# Patient Record
Sex: Female | Born: 1985 | Race: White | Hispanic: No | Marital: Married | Smoking: Former smoker
Health system: Southern US, Community
[De-identification: ages and names within clinical notes are randomized; demographics above are authoritative.]

## PROBLEM LIST (undated history)

## (undated) DIAGNOSIS — Z789 Other specified health status: Secondary | ICD-10-CM

## (undated) HISTORY — DX: Other specified health status: Z78.9

---

## 2013-09-17 ENCOUNTER — Emergency Department: Payer: Self-pay | Admitting: Emergency Medicine

## 2013-11-05 ENCOUNTER — Inpatient Hospital Stay: Payer: Self-pay | Admitting: Obstetrics and Gynecology

## 2013-11-05 LAB — PIH PROFILE
Anion Gap: 9 (ref 7–16)
BUN: 9 mg/dL (ref 7–18)
CHLORIDE: 104 mmol/L (ref 98–107)
CO2: 22 mmol/L (ref 21–32)
CREATININE: 0.5 mg/dL — AB (ref 0.60–1.30)
Calcium, Total: 8.8 mg/dL (ref 8.5–10.1)
EGFR (African American): >60
EGFR (Non-African Amer.): >60
Glucose: 62 mg/dL — ABNORMAL LOW (ref 65–99)
HCT: 34.4 % — AB (ref 35.0–47.0)
HGB: 11.6 g/dL — ABNORMAL LOW (ref 12.0–16.0)
MCH: 34 pg (ref 26.0–34.0)
MCHC: 33.8 g/dL (ref 32.0–36.0)
MCV: 101 fL — AB (ref 80–100)
Osmolality: 267 (ref 275–301)
PLATELETS: 214 10*3/uL (ref 150–440)
POTASSIUM: 3.3 mmol/L — AB (ref 3.5–5.1)
RBC: 3.42 10*6/uL — AB (ref 3.80–5.20)
RDW: 12.7 % (ref 11.5–14.5)
SGOT(AST): 18 U/L (ref 15–37)
SODIUM: 135 mmol/L — AB (ref 136–145)
URIC ACID: 3 mg/dL (ref 2.6–6.0)
WBC: 11.6 10*3/uL — ABNORMAL HIGH (ref 3.6–11.0)

## 2013-11-05 LAB — PROTEIN / CREATININE RATIO, URINE
CREATININE, URINE: 79.8 mg/dL (ref 30.0–125.0)
CREATININE, URINE: 44.6 mg/dL (ref 30.0–125.0)
PROTEIN/CREAT. RATIO: 202 mg/g{creat} — AB (ref 0–200)
Protein, Random Urine: 280 mg/dL — ABNORMAL HIGH (ref 0–12)
Protein, Random Urine: 9 mg/dL (ref 0–12)
Protein/Creat. Ratio: 3509 mg/gCREAT — ABNORMAL HIGH (ref 0–200)

## 2013-11-08 ENCOUNTER — Inpatient Hospital Stay: Payer: Self-pay

## 2013-11-08 LAB — CBC WITH DIFFERENTIAL/PLATELET
Basophil #: 0 10*3/uL (ref 0.0–0.1)
Basophil %: 0.2 %
Eosinophil #: 0.3 10*3/uL (ref 0.0–0.7)
Eosinophil %: 3.2 %
HCT: 34.2 % — ABNORMAL LOW (ref 35.0–47.0)
HGB: 11.9 g/dL — ABNORMAL LOW (ref 12.0–16.0)
Lymphocyte #: 3.3 10*3/uL (ref 1.0–3.6)
Lymphocyte %: 30.6 %
MCH: 34.9 pg — ABNORMAL HIGH (ref 26.0–34.0)
MCHC: 34.9 g/dL (ref 32.0–36.0)
MCV: 100 fL (ref 80–100)
Monocyte #: 0.7 x10 3/mm (ref 0.2–0.9)
Monocyte %: 6 %
NEUTROS PCT: 60 %
Neutrophil #: 6.5 10*3/uL (ref 1.4–6.5)
PLATELETS: 202 10*3/uL (ref 150–440)
RBC: 3.42 10*6/uL — AB (ref 3.80–5.20)
RDW: 12.9 % (ref 11.5–14.5)
WBC: 10.9 10*3/uL (ref 3.6–11.0)

## 2013-11-09 LAB — HEMATOCRIT: HCT: 30.7 % — AB (ref 35.0–47.0)

## 2014-09-06 NOTE — Op Note (Signed)
PATIENT NAME:  Christina [name], Christina [name] MR#:  [id number] DATE OF BIRTH:  May 04, 1986  DATE OF PROCEDURE:  11/08/2013  PREOPERATIVE DIAGNOSES:  1.  Intrauterine pregnancy at 37 weeks, 3 days gestational age. 2.  Mild preeclampsia.  3.  Christina [name] breech presentation.   POSTOPERATIVE DIAGNOSES: 1.  Intrauterine pregnancy at 37 weeks, 3 days gestational age. 2.  Mild preeclampsia.  3.  Christina [name] breech presentation.   PROCEDURE: Primary low transverse cesarean section via Pfannenstiel incision.   ANESTHESIA: Spinal.   SURGEON: Thomasene MohairStephen Jackson, M.[name].   ESTIMATED BLOOD LOSS: 750 mL.   OPERATIVE FLUIDS: 1000 mL crystalloid.   COMPLICATIONS: None.   FINDINGS:  1.  Normal-appearing gravid uterus with normal-appearing intrauterine cavity.  2.  Normal appearing fallopian tubes and ovaries.   SPECIMENS: None.   CONDITION AT THE END OF THE PROCEDURE: Stable.   PROCEDURE IN DETAIL: The patient was taken to the Operating Room where regional spinal anesthesia was administered and found to be adequate. She was placed in the dorsal supine position with a leftward tilt and prepped and draped in the usual sterile fashion. After a timeout was called, a Pfannenstiel incision was made and carried through the various layers until the peritoneum was identified and entered sharply. After extending the peritoneal opening, bladder flap was created and a bladder retractor was used to pull the bladder out of the operative area of interest. A low transverse hysterotomy was made with a scalpel and extended laterally with cranial and caudal tension. The fetal breech was grasped and with mild fundal pressure was delivered until both legs could be delivered.  A moistened blue towel was wrapped around the infant lower back and the rest of the body was elevated with delivery of each arm independently by sweeping the arm across the chest and delivering the arm separately. The neck was flexed by placing maxillary pressure and with fundal  pressure the head was delivered. The cord was clamped and cut and the infant handed to the pediatricians. The placenta was removed spontaneously intact with a three vessel cord. The uterus was cleared of all clots and debris and inspected for contour given the breech presentation. The uterine contour appeared to be normal.  The hysterotomy was closed using #3-0 Vicryl in a running, locked fashion. A second imbricating layer of 0-Vicryl was sewn in order to obtain hemostasis. The uterus was returned to the abdomen and the abdomen was cleared of all clots and debris.   The On-Q catheters were placed according to the manufacturer's recommendations. They were  placed about approximately 4 cm cephalad to the incision line and to a depth of approximately the 4th marking on the catheters. They were positioned just superficial to the rectus abdominis muscles and just deep to the rectus fascia. The fascia was closed using 1-0 looped PDS in a running fashion. Care was taken to avoid including the On-Q catheters in the closure. The subcutaneous tissue was irrigated and then reapproximated using 2-0 plain gut, such that no greater than 2 cm of dead space remained. The skin edges were reapproximated just below the dermal layer using 3-0 Vicryl to minimize skin closure tension. The skin was closed in a subcuticular fashion using #4-0 Monocryl in a subcuticular fashion. This closure was reinforced using benzoin and Steri-Strips.   The On-Q catheters were affixed to the skin using Dermabond, Steri-Strips, and Tegaderm. Each catheter was bolused with 5 mL of 0.5% Marcaine plain for a total of 10 mL.   The patient tolerated  the procedure well. Sponge, lap, and needle counts were correct x 2. For antibiotic prophylaxis, the patient received Ancef 2 grams prior to skin incision. For VTE prophylaxis, the patient was wearing pneumatic compression stockings which were on and operating throughout the entire procedure. She was taken  to the recovery area in stable condition.    ____________________________ Conard Novak, MD sdj:ts [name]: 11/08/2013 12:41:08 ET T: 11/08/2013 12:50:39 ET JOB#: [id number]  cc: Conard Novak, MD, <Dictator> Conard Novak MD ELECTRONICALLY SIGNED 11/23/2013 11:57

## 2014-09-23 NOTE — H&P (Signed)
L&D Evaluation:  History Expanded:  HPI 29 yo G1 at 27w0dgestational age by 8 wk ultrasound.  Pregnancy complicated by breech presentation.  Presented to L&D today for schedueld external cephalic version (see note for details of ECV).  BPs noted while here to be intermittently elevated in the mild range.  She has had no symptoms of headache, visual changes, or right upper quadrant pain.   Gravida 1   Blood Type (Maternal) O positive   Group B Strep Results Maternal (Result >5wks must be treated as unknown) positive   Maternal HIV Negative   Maternal Syphilis Ab Nonreactive   Maternal Varicella Immune   Rubella Results (Maternal) nonimmune   [facility]   Exam:  Vital Signs Afebrile, intermittently elevated BPs 140s/90s, otherwise nomral   Urine Protein UPC ratio 202   General no apparent distress   Mental Status clear   Chest clear   Heart normal sinus rhythm   Abdomen gravid, non-tender   Estimated Fetal Weight about 7 pounds   Fetal Position frank breech   Edema trace   Reflexes 2+   Mebranes Intact   FHT normal rate with no decels   FHT Description 125/mod var/+accels/no decels   Ucx irregular, infrequent   Skin no lesions   Impression:  Impression 1) Intrauterine pregnancy at [redacted]wks gestational age, 2) FPilar Platebreech presentation, 3) likely gestational hypertension (mild)   Plan:  Comments Patient to follow up in the office tomorrow (Thursday 6/25). Will reassess at that time. Discussed possible need to deliver early if BPs worsen. Will take out of work (next two days would be all she would miss) Gave strict precautions regarding Preeclampsia symptoms.  She should perform fetal movement counts. Of note: protein creatinine ratio initially 3500.  Repeated due to suspicion of lab erroer.  202 on repeat.   Follow Up Appointment already scheduled. tomorrow (6/25)   Labs:  Lab Results: Hepatic:  23-Jun-15 15:41   SGOT (AST) 18 (Result(s)  reported on 05 Nov 2013 at 10:38PM.)  Routine Chem:  23-Jun-15 15:41   Uric Acid, Serum 3.0 (Result(s) reported on 05 Nov 2013 at 10:38PM.)  Glucose, Serum  62  BUN 9  Creatinine (comp)  0.50  Sodium, Serum  135  Potassium, Serum  3.3  Chloride, Serum 104  CO2, Serum 22  Osmolality (calc) 267  Anion Gap 9  Calcium (Total), Serum 8.8  eGFR (Non-African American) >60 (eGFR values <652mmin/1.73 m2 may be an indication of chronic kidney disease (CKD). Calculated eGFR is useful in patients with stable renal function. The eGFR calculation will not be reliable in acutely ill patients when serum creatinine is changing rapidly. It is not useful in  patients on dialysis. The eGFR calculation may not be applicable to patients at the low and high extremes of body sizes, pregnant women, and vegetarians.)  eGFR (African American) >60  Misc Urine Chem:  23-Jun-15 21:25   Creatinine, Urine 79.8  Protein, Random Urine  280  Protein/Creat Ratio (comp)  3509 (Result(s) reported on 05 Nov 2013 at 10:18PM.)    22:53   Creatinine, Urine 44.6  Protein, Random Urine 9  Protein/Creat Ratio (comp)  202 (Result(s) reported on 05 Nov 2013 at 11:50PM.)  Routine Hem:  23-Jun-15 15:41   WBC (CBC)  11.6  RBC (CBC)  3.42  Hemoglobin (CBC)  11.6  Hematocrit (CBC)  34.4  Platelet Count (CBC) 214 (Result(s) reported on 05 Nov 2013 at 10:38PM.)  MCV  101  MCH 34.0  MCHC  33.8  RDW 12.7   Electronic Signatures: Will Bonnet (MD)  (Signed 24-Jun-15 00:22)  Authored: L&D Evaluation, Labs   Last Updated: 24-Jun-15 00:22 by Will Bonnet (MD)

## 2015-03-17 LAB — OB RESULTS CONSOLE HIV ANTIBODY (ROUTINE TESTING): HIV: NONREACTIVE

## 2015-03-17 LAB — OB RESULTS CONSOLE RUBELLA ANTIBODY, IGM: RUBELLA: IMMUNE

## 2015-03-17 LAB — OB RESULTS CONSOLE HEPATITIS B SURFACE ANTIGEN: Hepatitis B Surface Ag: NEGATIVE

## 2015-03-17 LAB — OB RESULTS CONSOLE VARICELLA ZOSTER ANTIBODY, IGG: VARICELLA IGG: IMMUNE

## 2015-03-17 LAB — OB RESULTS CONSOLE RPR: RPR: NONREACTIVE

## 2015-06-30 ENCOUNTER — Other Ambulatory Visit: Payer: Self-pay | Admitting: Obstetrics and Gynecology

## 2015-06-30 DIAGNOSIS — O2200 Varicose veins of lower extremity in pregnancy, unspecified trimester: Secondary | ICD-10-CM

## 2015-06-30 DIAGNOSIS — R1031 Right lower quadrant pain: Secondary | ICD-10-CM

## 2015-07-01 ENCOUNTER — Ambulatory Visit
Admission: RE | Admit: 2015-07-01 | Discharge: 2015-07-01 | Disposition: A | Discharge status: Home / Self Care | Payer: BC Managed Care – PPO | Source: Ambulatory Visit | Attending: Obstetrics and Gynecology | Admitting: Obstetrics and Gynecology

## 2015-07-01 DIAGNOSIS — O2200 Varicose veins of lower extremity in pregnancy, unspecified trimester: Secondary | ICD-10-CM | POA: Diagnosis not present

## 2015-07-01 DIAGNOSIS — R1030 Lower abdominal pain, unspecified: Secondary | ICD-10-CM | POA: Diagnosis not present

## 2015-07-01 DIAGNOSIS — M7989 Other specified soft tissue disorders: Secondary | ICD-10-CM | POA: Insufficient documentation

## 2015-07-01 DIAGNOSIS — Z3A00 Weeks of gestation of pregnancy not specified: Secondary | ICD-10-CM | POA: Insufficient documentation

## 2015-07-01 DIAGNOSIS — R1031 Right lower quadrant pain: Secondary | ICD-10-CM

## 2015-07-23 ENCOUNTER — Encounter: Payer: Self-pay | Admitting: General Surgery

## 2015-07-23 ENCOUNTER — Ambulatory Visit (INDEPENDENT_AMBULATORY_CARE_PROVIDER_SITE_OTHER): Payer: BC Managed Care – PPO | Admitting: General Surgery

## 2015-07-23 VITALS — BP 110/70 | HR 82 | Resp 12 | Ht 62.0 in | Wt 184.0 lb

## 2015-07-23 DIAGNOSIS — I83811 Varicose veins of right lower extremities with pain: Secondary | ICD-10-CM

## 2015-07-23 NOTE — Progress Notes (Signed)
Patient ID: Christina [name], female   DOB: 05/08/1986, 30 y.o.   MRN: [id number]  Chief Complaint  Patient presents with  . Varicose Veins    HPI Christina [name] is a 30 y.o. female.  Here today for evaluaiton of varicose vein. She is [redacted] weeks pregnant.  She states she has some pain from the right groin down her right leg. She did have an ultrasound that showed no clots.  She states the pain comes and goes. The discoloration is blue in color. She stats it first started the end of January . She states the vein is "lumpy" and tender. I have reviewed the history of present illness with the patient.  HPI  History reviewed. No pertinent past medical history.  Past Surgical History  Procedure Laterality Date  . Cesarean section  11-08-13    History reviewed. No pertinent family history.  Social History Social History  Substance Use Topics  . Smoking status: Former Smoker    Quit date: 05/16/2014  . Smokeless tobacco: None  . Alcohol Use: No    No Known Allergies  Current Outpatient Prescriptions  Medication Sig Dispense Refill  . Multiple Vitamins-Minerals (MULTIVITAMIN WITH MINERALS) tablet Take 1 tablet by mouth daily.     No current facility-administered medications for this visit.    Review of Systems Review of Systems  Constitutional: Negative.   Respiratory: Negative.   Cardiovascular: Negative.     Blood pressure 110/70, pulse 82, resp. rate 12, height 5\' 2"  (1.575 m), weight 184 lb (83.462 kg).  Physical Exam Physical Exam  Constitutional: She is oriented to person, place, and time. She appears well-developed and well-nourished.  HENT:  Mouth/Throat: Oropharynx is clear and moist.  Eyes: Conjunctivae are normal. No scleral icterus.  Neck: Neck supple.  Cardiovascular: Normal rate, regular rhythm and normal heart sounds.   Pulses:      Dorsalis pedis pulses are 2+ on the right side, and 2+ on the left side.       Posterior tibial pulses are 2+ on the right  side, and 2+ on the left side.  Scattered VV right groin area, some outer right thigh. NO edema.   Pulmonary/Chest: Effort normal and breath sounds normal.  Lymphadenopathy:    She has no cervical adenopathy.  Neurological: She is alert and oriented to person, place, and time.  Skin: Skin is warm and dry.  Psychiatric: Her behavior is normal.    Data Reviewed  Venous Duplex study  Assessment    VV in right groin and thigh. No evidence of phlebitis-either deep or superficial.     Plan   Disucussed findings with pt. She does require compression hose which has been prescribed already by her obstetrician. Potential problems with VV explained. Also advised against pronged sitting or standing.   Follow up in one month. Obtain and wear compression hose.     [facility] RE[facility] COLEEN GUTIERREZ CNM    Katerra Ingman G 07/27/2015, 8:57 AM

## 2015-07-23 NOTE — Patient Instructions (Signed)
The patient is aware to call back for any questions or concerns.  

## 2015-07-27 ENCOUNTER — Encounter: Payer: Self-pay | Admitting: General Surgery

## 2015-08-24 ENCOUNTER — Encounter: Payer: Self-pay | Admitting: General Surgery

## 2015-08-24 ENCOUNTER — Ambulatory Visit (INDEPENDENT_AMBULATORY_CARE_PROVIDER_SITE_OTHER): Payer: BC Managed Care – PPO | Admitting: General Surgery

## 2015-08-24 VITALS — BP 118/70 | HR 78 | Resp 12 | Ht 62.0 in | Wt 185.0 lb

## 2015-08-24 DIAGNOSIS — I83811 Varicose veins of right lower extremities with pain: Secondary | ICD-10-CM | POA: Diagnosis not present

## 2015-08-24 NOTE — Progress Notes (Signed)
This is a [age] female here today for right leg VV. Patient states no change since last visit.  She has been wearing her compression hose daily as advised. Still feels some burning in upper right thigh area, not any worse. I have reviewed the history of present illness with the patient.  Exam shows the VV cluster in upper right thigh anterior and lateral. Smaller cluster in calf area. No evidence of superficial phlebitis No edema in her legs.    Patient to return at the end of June -due date is May 29- for bilateral ultrasound. Continue to wear compression hose.  PCP:  [facility]  This information has been scribed by Ples SpecterJessica Qualls CMA.

## 2015-08-24 NOTE — Patient Instructions (Signed)
Return in two months

## 2015-08-25 ENCOUNTER — Encounter: Payer: Self-pay | Admitting: General Surgery

## 2015-09-30 ENCOUNTER — Inpatient Hospital Stay
Admission: EM | Admit: 2015-09-30 | Discharge: 2015-10-02 | DRG: 765 | Disposition: A | Discharge status: Home / Self Care | Payer: BC Managed Care – PPO | Attending: Advanced Practice Midwife | Admitting: Advanced Practice Midwife

## 2015-09-30 DIAGNOSIS — Z87891 Personal history of nicotine dependence: Secondary | ICD-10-CM | POA: Diagnosis not present

## 2015-09-30 DIAGNOSIS — O34219 Maternal care for unspecified type scar from previous cesarean delivery: Secondary | ICD-10-CM | POA: Diagnosis present

## 2015-09-30 DIAGNOSIS — Z3A38 38 weeks gestation of pregnancy: Secondary | ICD-10-CM

## 2015-09-30 DIAGNOSIS — O9081 Anemia of the puerperium: Secondary | ICD-10-CM | POA: Diagnosis not present

## 2015-09-30 DIAGNOSIS — D62 Acute posthemorrhagic anemia: Secondary | ICD-10-CM | POA: Diagnosis not present

## 2015-09-30 DIAGNOSIS — O3433 Maternal care for cervical incompetence, third trimester: Secondary | ICD-10-CM | POA: Diagnosis present

## 2015-09-30 DIAGNOSIS — O4292 Full-term premature rupture of membranes, unspecified as to length of time between rupture and onset of labor: Principal | ICD-10-CM | POA: Diagnosis present

## 2015-09-30 LAB — CBC
HCT: 34.4 % — ABNORMAL LOW (ref 35.0–47.0)
Hemoglobin: 11.7 g/dL — ABNORMAL LOW (ref 12.0–16.0)
MCH: 31.8 pg (ref 26.0–34.0)
MCHC: 34.1 g/dL (ref 32.0–36.0)
MCV: 93.1 fL (ref 80.0–100.0)
PLATELETS: 204 10*3/uL (ref 150–440)
RBC: 3.69 MIL/uL — ABNORMAL LOW (ref 3.80–5.20)
RDW: 13.2 % (ref 11.5–14.5)
WBC: 11.1 10*3/uL — AB (ref 3.6–11.0)

## 2015-09-30 LAB — RAPID HIV SCREEN (HIV 1/2 AB+AG)
HIV 1/2 ANTIBODIES: NONREACTIVE
HIV-1 P24 ANTIGEN - HIV24: NONREACTIVE

## 2015-09-30 LAB — TYPE AND SCREEN
ABO/RH(D): O POS
ANTIBODY SCREEN: NEGATIVE

## 2015-09-30 LAB — ABO/RH: ABO/RH(D): O POS

## 2015-09-30 MED ORDER — BUTORPHANOL TARTRATE 1 MG/ML IJ SOLN
1.0000 mg | INTRAMUSCULAR | Status: DC | PRN
  Administered 2015-10-01: 1 mg via INTRAVENOUS
  Filled 2015-09-30: qty 1

## 2015-09-30 MED ORDER — MISOPROSTOL 200 MCG PO TABS
ORAL_TABLET | ORAL | Status: AC
  Filled 2015-09-30: qty 4

## 2015-09-30 MED ORDER — OXYTOCIN 40 UNITS IN LACTATED RINGERS INFUSION - SIMPLE MED
2.5000 [IU]/h | INTRAVENOUS | Status: DC
  Administered 2015-10-01: 500 mL via INTRAVENOUS
  Filled 2015-09-30: qty 1000

## 2015-09-30 MED ORDER — ONDANSETRON HCL 4 MG/2ML IJ SOLN: 4.0000 mg | Freq: Four times a day (QID) | INTRAMUSCULAR | Status: DC | PRN

## 2015-09-30 MED ORDER — SODIUM CHLORIDE 0.9 % IV SOLN
INTRAVENOUS | Status: AC
  Administered 2015-09-30: 2 g via INTRAVENOUS
  Filled 2015-09-30: qty 2000

## 2015-09-30 MED ORDER — LIDOCAINE HCL (PF) 1 % IJ SOLN
INTRAMUSCULAR | Status: AC
  Filled 2015-09-30: qty 30

## 2015-09-30 MED ORDER — LACTATED RINGERS IV SOLN
500.0000 mL | INTRAVENOUS | Status: DC | PRN
  Administered 2015-10-01: 500 mL via INTRAVENOUS

## 2015-09-30 MED ORDER — TERBUTALINE SULFATE 1 MG/ML IJ SOLN: 0.2500 mg | Freq: Once | INTRAMUSCULAR | Status: DC | PRN

## 2015-09-30 MED ORDER — BUTORPHANOL TARTRATE 1 MG/ML IJ SOLN
2.0000 mg | INTRAMUSCULAR | Status: DC | PRN
  Administered 2015-09-30: 2 mg via INTRAVENOUS
  Filled 2015-09-30: qty 2

## 2015-09-30 MED ORDER — SODIUM CHLORIDE 0.9 % IV SOLN
2.0000 g | Freq: Once | INTRAVENOUS | Status: AC
  Administered 2015-09-30: 2 g via INTRAVENOUS

## 2015-09-30 MED ORDER — LACTATED RINGERS IV SOLN
INTRAVENOUS | Status: DC
  Administered 2015-09-30: 125 mL/h via INTRAVENOUS
  Administered 2015-09-30 (×2): via INTRAVENOUS

## 2015-09-30 MED ORDER — SODIUM CHLORIDE 0.9 % IV SOLN
1.0000 g | INTRAVENOUS | Status: DC
  Administered 2015-09-30 – 2015-10-01 (×4): 1 g via INTRAVENOUS
  Filled 2015-09-30 (×5): qty 1000

## 2015-09-30 MED ORDER — OXYTOCIN BOLUS FROM INFUSION: 500.0000 mL | INTRAVENOUS | Status: DC

## 2015-09-30 MED ORDER — AMMONIA AROMATIC IN INHA
RESPIRATORY_TRACT | Status: AC
  Filled 2015-09-30: qty 10

## 2015-09-30 MED ORDER — OXYTOCIN 10 UNIT/ML IJ SOLN
INTRAMUSCULAR | Status: AC
  Filled 2015-09-30: qty 2

## 2015-09-30 MED ORDER — OXYTOCIN 40 UNITS IN LACTATED RINGERS INFUSION - SIMPLE MED
1.0000 m[IU]/min | INTRAVENOUS | Status: DC
  Filled 2015-09-30: qty 1000

## 2015-09-30 MED ORDER — OXYTOCIN 40 UNITS IN LACTATED RINGERS INFUSION - SIMPLE MED
1.0000 m[IU]/min | INTRAVENOUS | Status: DC
  Administered 2015-09-30: 1 m[IU]/min via INTRAVENOUS
  Administered 2015-09-30: 2 m[IU]/min via INTRAVENOUS
  Administered 2015-09-30: 3 m[IU]/min via INTRAVENOUS

## 2015-09-30 MED ORDER — ACETAMINOPHEN 325 MG PO TABS: 650.0000 mg | ORAL_TABLET | ORAL | Status: DC | PRN

## 2015-09-30 NOTE — Plan of Care (Signed)
Report received/Assumed care of pt.  Pt is G21001 EDC 10/12/2015  38.2 weeks.  Pt ruptured at 0250 this am. Clear large amount of fluid. Pt is a previous c/section for breech presentation. She is planning as of this time to VBAC/has discussed with Dr. Tiburcio PeaHarris at length.  Report given stated pt is 0.5/40/-3.  Pt is having a "Boy" Zachery and plans to breast feed her son.  Husband Bethann BerkshireJohnny and family at bedside.  Pt is GBS negative per Pt.  Will call office for other labs at 0800.  Ellison Carwin Josehua Hammar RNC

## 2015-09-30 NOTE — Progress Notes (Signed)
L&D Note  09/30/2015 - 2:38 PM  30 y.o. G2P1 551w2d   [name] is admitted for PROM   Subjective:  Feeling more painful ctx  Objective:   Filed Vitals:   09/30/15 0900 09/30/15 1018 09/30/15 1219 09/30/15 1358  BP:  131/87 123/76 124/75  Pulse:  80 74 59  Temp: 98.1 F (36.7 C) 98.1 F (36.7 C) 98 F (36.7 C) 98.2 F (36.8 C)  TempSrc: Oral Oral Oral Oral  Resp:      Height:      Weight:        Current Vital Signs 24h Vital Sign Ranges  T 98.2 F (36.8 C) Temp  Avg: 98.1 F (36.7 C)  Min: 97.8 F (36.6 C)  Max: 98.4 F (36.9 C)  BP  (37309) BP  Min: 113/64  Max: 139/86  HR (!) 59 Pulse  Avg: 80.8  Min: 59  Max: 94  RR 19 Resp  Avg: 19  Min: 19  Max: 19  SaO2   Not Delivered No Data Recorded       24 Hour I/O Current Shift I/O  Time Ins Outs   05/17 0701 - 05/17 1900 In: 875 [I.V.:875] Out: -     FHR: category 1 Toco: q 2-4 min SVE: 1/50/-2   Assessment :  IUP at 5951w2d, TOLAC after PROM    Plan:  Increase max pitocin to 10 mu/min for now Pain management per pt request Con't ampicillin for GBS+ swab  Marta AntuBrothers, Concepcion Kirkpatrick, [place]

## 2015-09-30 NOTE — H&P (Signed)
Obstetrics Admission History & Physical   Spontaneous Rupture of Membranes   HPI:  30 y.o. G2P1 @ 4131w2d (10/12/2015, by Patient Reported). Admitted on 09/30/2015:   Presents for 0200 SROM, min ctxs since that time.  [facility] previous h/o CS for breech 2 years ago, no other complications.  Informed consent for VBAC thru prenatal care and still desires.  Denies diabetes, HTN, PTL, growth concerns of fetus.  GBS neg.  PMHx: History reviewed. No pertinent past medical history. PSHx:  Past Surgical History  Procedure Laterality Date  . Cesarean section  11-08-13   Medications:  Prescriptions prior to admission  Medication Sig Dispense Refill Last Dose  . Multiple Vitamins-Minerals (MULTIVITAMIN WITH MINERALS) tablet Take 1 tablet by mouth daily.   Taking   Allergies: has No Known Allergies. OBHx:  OB History  Gravida Para Term Preterm AB SAB TAB Ectopic Multiple Living  2 1            # Outcome Date GA Lbr Len/2nd Weight Sex Delivery Anes PTL Lv  2 Current           1 Para             Obstetric Comments  1st Menstrual Cycle:  ?   1st Pregnancy:  27   UJW:JXBJYNWG/[id number]:Negative/unremarkable except as detailed in HPI. Soc Hx: Pregnancy welcomed  Objective:   Filed Vitals:   09/30/15 0405  BP: 133/88  Pulse: 94  Temp: 98.4 F (36.9 C)  Resp: 19   General: Well nourished, well developed female in no acute distress.  Skin: Warm and dry.  Cardiovascular:Regular rate and rhythm. Respiratory: Clear to auscultation bilateral. Normal respiratory effort Abdomen: gravid, VTX, NT Neuro/Psych: Normal mood and affect.   Pelvic exam: is not limited by body habitus EGBUS: within normal limits Vagina: within normal limits and with no blood in the vault Cervix: FT, 40, -3 Uterus: Spontaneous uterine activity  Adnexa: not evaluated  EFM:FHR: 130 bpm, variability: moderate,  accelerations:  Present,  decelerations:  Absent Toco: Frequency: Every 5-7  minutes   Perinatal info:  Blood type: pending records Rubella- Unknown Varicella -Unknown TDaP Given during third trimester of this pregnancy RPR NR / HIV Neg/ HBsAg Neg   Assessment & Plan:   30 y.o. G2P1 @ 6731w2d, Admitted on 09/30/2015: with SROM 3 hours ago with some contractions and min dilation to cervix.  Options for VTOL/ VBAC discussed in detail at this time.  Pros and cons, risks.  Pt is early as far as cervix is concerned and not able to use ripening agents.  Options of expectant management at first and even augmentation with Pitocin discussed.  Uterine rupture risks discussed. [facility]..Marland Kitchen

## 2015-09-30 NOTE — Progress Notes (Signed)
L&D Note  09/30/2015 - 6:28 PM  30 y.o. G2P1 7684w2d   [name] is admitted for PROM   Subjective:  Requesting pain medication  Objective:   Filed Vitals:   09/30/15 1219 09/30/15 1358 09/30/15 1504 09/30/15 1740  BP: 123/76 124/75 123/69 153/85  Pulse: 74 59 64 74  Temp: 98 F (36.7 C) 98.2 F (36.8 C) 98.3 F (36.8 C) 98.1 F (36.7 C)  TempSrc: Oral Oral Oral Oral  Resp:   16 16  Height:      Weight:        Current Vital Signs 24h Vital Sign Ranges  T 98.1 F (36.7 C) Temp  Avg: 98.1 F (36.7 C)  Min: 97.8 F (36.6 C)  Max: 98.4 F (36.9 C)  BP (!) 153/85 mmHg BP  Min: 113/64  Max: 153/85  HR 74 Pulse  Avg: 77.9  Min: 59  Max: 94  RR 16 Resp  Avg: 17  Min: 16  Max: 19  SaO2   Not Delivered No Data Recorded       24 Hour I/O Current Shift I/O  Time Ins Outs   05/17 0701 - 05/17 1900 In: 875 [I.V.:875] Out: -     FHR: category 1 tracing, baseline 140 Toco: q 2-3 min SVE: 3/50/-2 after AROM   Assessment :  IUP at 4184w2d, TOLAC, PROM    Plan:  AROM of forebag Stadol now, recheck for cervical change in about 2 hours. Pt verbalizes concern about minimal progress. Reviewed expected labor progress given unfavorable cervix and PROM on admission. At this point progress is as expected. Will continue to monitor.   Marta AntuBrothers, Suyash Amory, [place]

## 2015-09-30 NOTE — Progress Notes (Signed)
S: Pt still with irregular ctx, denies increasing strength of contractions  O: Category 1 tracing. Ctx irregular.  Prenatal labs: O+/RI/VI/panel negative/GBS +  A: IUP at 5143w2d, PROM  P: VBAC protocol initiated - Dr Jean RosenthalJackson, anesthesia, OR and Nursing Supervisor aware.  Will begin low-dose Pitocin (start at 1 mu/min, increase q 30 min until 5 mu/min until favorable cervix)  Begin ampicillin for GBS ppx Stadol/epidural as desired

## 2015-10-01 ENCOUNTER — Encounter: Payer: Self-pay | Admitting: Anesthesiology

## 2015-10-01 ENCOUNTER — Inpatient Hospital Stay: Payer: BC Managed Care – PPO | Admitting: Anesthesiology

## 2015-10-01 ENCOUNTER — Encounter
Admission: EM | Disposition: A | Discharge status: Home / Self Care | Payer: Self-pay | Source: Home / Self Care | Attending: Advanced Practice Midwife

## 2015-10-01 LAB — RPR: RPR Ser Ql: NONREACTIVE

## 2015-10-01 LAB — CBC
HEMATOCRIT: 34.7 % — AB (ref 35.0–47.0)
HEMOGLOBIN: 11.8 g/dL — AB (ref 12.0–16.0)
MCH: 32.1 pg (ref 26.0–34.0)
MCHC: 34 g/dL (ref 32.0–36.0)
MCV: 94.5 fL (ref 80.0–100.0)
Platelets: 207 10*3/uL (ref 150–440)
RBC: 3.67 MIL/uL — AB (ref 3.80–5.20)
RDW: 13.2 % (ref 11.5–14.5)
WBC: 13 10*3/uL — ABNORMAL HIGH (ref 3.6–11.0)

## 2015-10-01 SURGERY — Surgical Case
Anesthesia: Choice | Wound class: Clean Contaminated

## 2015-10-01 MED ORDER — SIMETHICONE 80 MG PO CHEW
80.0000 mg | CHEWABLE_TABLET | Freq: Three times a day (TID) | ORAL | Status: DC
  Administered 2015-10-01 – 2015-10-02 (×5): 80 mg via ORAL
  Filled 2015-10-01 (×5): qty 1

## 2015-10-01 MED ORDER — NALBUPHINE HCL 10 MG/ML IJ SOLN: 5.0000 mg | INTRAMUSCULAR | Status: DC | PRN

## 2015-10-01 MED ORDER — CITRIC ACID-SODIUM CITRATE 334-500 MG/5ML PO SOLN
30.0000 mL | ORAL | Status: AC
  Administered 2015-10-01: 30 mL via ORAL
  Filled 2015-10-01: qty 30

## 2015-10-01 MED ORDER — COCONUT OIL OIL
1.0000 | TOPICAL_OIL | Status: DC | PRN
Start: 2015-10-01 — End: 2015-10-03
  Administered 2015-10-01: 1 via TOPICAL
  Filled 2015-10-01: qty 120

## 2015-10-01 MED ORDER — IBUPROFEN 600 MG PO TABS: 600.0000 mg | ORAL_TABLET | Freq: Four times a day (QID) | ORAL | Status: DC | PRN

## 2015-10-01 MED ORDER — OXYTOCIN 40 UNITS IN LACTATED RINGERS INFUSION - SIMPLE MED
2.5000 [IU]/h | INTRAVENOUS | Status: DC
  Administered 2015-10-01: 2.5 [IU]/h via INTRAVENOUS

## 2015-10-01 MED ORDER — LIDOCAINE HCL (PF) 2 % IJ SOLN
INTRAMUSCULAR | Status: DC | PRN
  Administered 2015-10-01 (×4): 5 mg via INTRADERMAL

## 2015-10-01 MED ORDER — DIPHENHYDRAMINE HCL 25 MG PO CAPS: 25.0000 mg | ORAL_CAPSULE | Freq: Four times a day (QID) | ORAL | Status: DC | PRN

## 2015-10-01 MED ORDER — ONDANSETRON HCL 4 MG/2ML IJ SOLN: 4.0000 mg | Freq: Three times a day (TID) | INTRAMUSCULAR | Status: DC | PRN

## 2015-10-01 MED ORDER — LACTATED RINGERS IV SOLN
INTRAVENOUS | Status: DC
  Administered 2015-10-02: 05:00:00 via INTRAVENOUS

## 2015-10-01 MED ORDER — BUPIVACAINE 0.25 % ON-Q PUMP DUAL CATH 400 ML
400.0000 mL | INJECTION | Status: DC
  Filled 2015-10-01: qty 400

## 2015-10-01 MED ORDER — IBUPROFEN 600 MG PO TABS
600.0000 mg | ORAL_TABLET | Freq: Four times a day (QID) | ORAL | Status: DC
  Administered 2015-10-01: 600 mg via ORAL
  Filled 2015-10-01 (×2): qty 1

## 2015-10-01 MED ORDER — SCOPOLAMINE 1 MG/3DAYS TD PT72: 1.0000 | MEDICATED_PATCH | Freq: Once | TRANSDERMAL | Status: DC

## 2015-10-01 MED ORDER — NALOXONE HCL 0.4 MG/ML IJ SOLN: 0.4000 mg | INTRAMUSCULAR | Status: DC | PRN

## 2015-10-01 MED ORDER — DIPHENHYDRAMINE HCL 25 MG PO CAPS
25.0000 mg | ORAL_CAPSULE | ORAL | Status: DC | PRN
Start: 2015-10-01 — End: 2015-10-03

## 2015-10-01 MED ORDER — DIBUCAINE 1 % RE OINT: 1.0000 "application " | TOPICAL_OINTMENT | RECTAL | Status: DC | PRN

## 2015-10-01 MED ORDER — SODIUM CHLORIDE 0.9% FLUSH: 3.0000 mL | INTRAVENOUS | Status: DC | PRN

## 2015-10-01 MED ORDER — KETOROLAC TROMETHAMINE 30 MG/ML IJ SOLN: 30.0000 mg | Freq: Four times a day (QID) | INTRAMUSCULAR | Status: DC | PRN

## 2015-10-01 MED ORDER — NALBUPHINE HCL 10 MG/ML IJ SOLN: 5.0000 mg | Freq: Once | INTRAMUSCULAR | Status: DC | PRN

## 2015-10-01 MED ORDER — WITCH HAZEL-GLYCERIN EX PADS: 1.0000 "application " | MEDICATED_PAD | CUTANEOUS | Status: DC | PRN

## 2015-10-01 MED ORDER — CEFAZOLIN SODIUM-DEXTROSE 2-4 GM/100ML-% IV SOLN
2.0000 g | INTRAVENOUS | Status: AC
  Administered 2015-10-01: 2 g via INTRAVENOUS
  Filled 2015-10-01 (×2): qty 100

## 2015-10-01 MED ORDER — PRENATAL MULTIVITAMIN CH
1.0000 | ORAL_TABLET | Freq: Every day | ORAL | Status: DC
  Administered 2015-10-02: 1 via ORAL
  Filled 2015-10-01: qty 1

## 2015-10-01 MED ORDER — OXYCODONE-ACETAMINOPHEN 5-325 MG PO TABS: 2.0000 | ORAL_TABLET | ORAL | Status: DC | PRN

## 2015-10-01 MED ORDER — MEPERIDINE HCL 25 MG/ML IJ SOLN: 6.2500 mg | INTRAMUSCULAR | Status: DC | PRN

## 2015-10-01 MED ORDER — MORPHINE SULFATE (PF) 0.5 MG/ML IJ SOLN: INTRAMUSCULAR | Status: DC | PRN

## 2015-10-01 MED ORDER — MENTHOL 3 MG MT LOZG: 1.0000 | LOZENGE | OROMUCOSAL | Status: DC | PRN

## 2015-10-01 MED ORDER — NALOXONE HCL 2 MG/2ML IJ SOSY
1.0000 ug/kg/h | PREFILLED_SYRINGE | INTRAVENOUS | Status: DC | PRN
  Filled 2015-10-01: qty 2

## 2015-10-01 MED ORDER — DEXTROSE 5 % IV SOLN
500.0000 mg | INTRAVENOUS | Status: AC
  Administered 2015-10-01: 500 mg via INTRAVENOUS
  Filled 2015-10-01: qty 500

## 2015-10-01 MED ORDER — DIPHENHYDRAMINE HCL 50 MG/ML IJ SOLN: 12.5000 mg | INTRAMUSCULAR | Status: DC | PRN

## 2015-10-01 MED ORDER — BUPIVACAINE ON-Q PAIN PUMP (FOR ORDER SET NO CHG)
INJECTION | Status: DC
  Filled 2015-10-01: qty 1

## 2015-10-01 MED ORDER — IBUPROFEN 600 MG PO TABS
600.0000 mg | ORAL_TABLET | Freq: Four times a day (QID) | ORAL | Status: DC
Start: 2015-10-01 — End: 2015-10-02
  Administered 2015-10-01 – 2015-10-02 (×2): 600 mg via ORAL
  Filled 2015-10-01 (×2): qty 1

## 2015-10-01 MED ORDER — BUPIVACAINE HCL (PF) 0.5 % IJ SOLN
5.0000 mL | Freq: Once | INTRAMUSCULAR | Status: DC
  Filled 2015-10-01: qty 30

## 2015-10-01 MED ORDER — OXYCODONE-ACETAMINOPHEN 5-325 MG PO TABS: 1.0000 | ORAL_TABLET | ORAL | Status: DC | PRN

## 2015-10-01 MED ORDER — MORPHINE SULFATE (PF) 2 MG/ML IV SOLN: 1.0000 mg | INTRAVENOUS | Status: DC | PRN

## 2015-10-01 MED ORDER — MORPHINE SULFATE (PF) 0.5 MG/ML IJ SOLN
INTRAMUSCULAR | Status: DC | PRN
  Administered 2015-10-01: 3 mg via EPIDURAL

## 2015-10-01 MED ORDER — ONDANSETRON HCL 4 MG/2ML IJ SOLN: 4.0000 mg | Freq: Once | INTRAMUSCULAR | Status: DC | PRN

## 2015-10-01 MED ORDER — FENTANYL 2.5 MCG/ML W/ROPIVACAINE 0.2% IN NS 100 ML EPIDURAL INFUSION (ARMC-ANES): 10.0000 mL/h | EPIDURAL | Status: DC

## 2015-10-01 MED ORDER — OXYTOCIN 40 UNITS IN LACTATED RINGERS INFUSION - SIMPLE MED
INTRAVENOUS | Status: AC
  Filled 2015-10-01: qty 1000

## 2015-10-01 MED ORDER — BUPIVACAINE HCL (PF) 0.25 % IJ SOLN
INTRAMUSCULAR | Status: DC | PRN
  Administered 2015-10-01 (×2): 4 mL via EPIDURAL

## 2015-10-01 MED ORDER — BUPIVACAINE HCL (PF) 0.5 % IJ SOLN: 5.0000 mL | Freq: Once | INTRAMUSCULAR | Status: DC

## 2015-10-01 MED ORDER — LIDOCAINE-EPINEPHRINE (PF) 1.5 %-1:200000 IJ SOLN
INTRAMUSCULAR | Status: DC | PRN
  Administered 2015-10-01: 3 mL via EPIDURAL

## 2015-10-01 MED ORDER — BUPIVACAINE HCL (PF) 0.5 % IJ SOLN
INTRAMUSCULAR | Status: DC | PRN
  Administered 2015-10-01: 10 mL

## 2015-10-01 MED ORDER — FERROUS SULFATE 325 (65 FE) MG PO TABS
325.0000 mg | ORAL_TABLET | Freq: Two times a day (BID) | ORAL | Status: DC
  Administered 2015-10-01 – 2015-10-02 (×3): 325 mg via ORAL
  Filled 2015-10-01 (×3): qty 1

## 2015-10-01 MED ORDER — DIPHENHYDRAMINE HCL 25 MG PO CAPS: 25.0000 mg | ORAL_CAPSULE | ORAL | Status: DC | PRN

## 2015-10-01 MED ORDER — SENNOSIDES-DOCUSATE SODIUM 8.6-50 MG PO TABS
2.0000 | ORAL_TABLET | ORAL | Status: DC
  Administered 2015-10-01: 2 via ORAL
  Filled 2015-10-01: qty 2

## 2015-10-01 MED ORDER — BUPIVACAINE HCL (PF) 0.5 % IJ SOLN
INTRAMUSCULAR | Status: DC | PRN
  Administered 2015-10-01 (×3): 2 mL

## 2015-10-01 MED ORDER — FENTANYL CITRATE (PF) 100 MCG/2ML IJ SOLN
25.0000 ug | INTRAMUSCULAR | Status: DC | PRN
  Administered 2015-10-01 (×2): 25 ug via INTRAVENOUS
  Filled 2015-10-01: qty 2

## 2015-10-01 MED ORDER — FENTANYL 2.5 MCG/ML W/ROPIVACAINE 0.2% IN NS 100 ML EPIDURAL INFUSION (ARMC-ANES)
EPIDURAL | Status: AC
  Administered 2015-10-01: 10 mL/h via EPIDURAL
  Filled 2015-10-01: qty 100

## 2015-10-01 SURGICAL SUPPLY — 29 items
CANISTER SUCT 3000ML (MISCELLANEOUS) ×3 IMPLANT
CATH KIT ON-Q SILVERSOAK 5IN (CATHETERS) ×6 IMPLANT
CLOSURE WOUND 1/2 X4 (GAUZE/BANDAGES/DRESSINGS) ×1
DRSG OPSITE POSTOP 4X10 (GAUZE/BANDAGES/DRESSINGS) ×6 IMPLANT
DRSG TELFA 3X8 NADH (GAUZE/BANDAGES/DRESSINGS) ×3 IMPLANT
ELECT CAUTERY BLADE 6.4 (BLADE) ×3 IMPLANT
ELECT REM PT RETURN 9FT ADLT (ELECTROSURGICAL) ×3
ELECTRODE REM PT RTRN 9FT ADLT (ELECTROSURGICAL) ×1 IMPLANT
GAUZE SPONGE 4X4 12PLY STRL (GAUZE/BANDAGES/DRESSINGS) IMPLANT
GLOVE BIO SURGEON STRL SZ7 (GLOVE) ×6 IMPLANT
GLOVE INDICATOR 7.5 STRL GRN (GLOVE) ×12 IMPLANT
GOWN STRL REUS W/ TWL LRG LVL3 (GOWN DISPOSABLE) ×3 IMPLANT
GOWN STRL REUS W/TWL LRG LVL3 (GOWN DISPOSABLE) ×6
LIQUID BAND (GAUZE/BANDAGES/DRESSINGS) ×6 IMPLANT
NS IRRIG 1000ML POUR BTL (IV SOLUTION) ×3 IMPLANT
PACK C SECTION AR (MISCELLANEOUS) ×3 IMPLANT
PAD OB MATERNITY 4.3X12.25 (PERSONAL CARE ITEMS) ×3 IMPLANT
PAD PREP 24X41 OB/GYN DISP (PERSONAL CARE ITEMS) ×3 IMPLANT
SPONGE LAP 18X18 5 PK (GAUZE/BANDAGES/DRESSINGS) ×12 IMPLANT
STRIP CLOSURE SKIN 1/2X4 (GAUZE/BANDAGES/DRESSINGS) ×2 IMPLANT
SUT CHROMIC GUT BROWN 0 54 (SUTURE) ×1 IMPLANT
SUT CHROMIC GUT BROWN 0 54IN (SUTURE) ×3
SUT MNCRL 4-0 (SUTURE) ×2
SUT MNCRL 4-0 27XMFL (SUTURE) ×1
SUT PDS AB 1 TP1 96 (SUTURE) ×3 IMPLANT
SUT PLAIN 2 0 XLH (SUTURE) ×3 IMPLANT
SUT VIC AB 0 CT1 36 (SUTURE) ×12 IMPLANT
SUTURE MNCRL 4-0 27XMF (SUTURE) ×1 IMPLANT
SWABSTK COMLB BENZOIN TINCTURE (MISCELLANEOUS) ×3 IMPLANT

## 2015-10-01 NOTE — Transfer of Care (Signed)
Immediate Anesthesia Transfer of Care Note  Patient: Christina [name]  Procedure(s) Performed: Procedure(s): CESAREAN SECTION (N/A)  Patient Location: PACU  Anesthesia Type:General  Level of Consciousness: sedated  Airway & Oxygen Therapy: Patient Spontanous Breathing and Patient connected to face mask oxygen  Post-op Assessment: Report given to RN and Post -op Vital signs reviewed and stable  Post vital signs: Reviewed and stable  Last Vitals:  Filed Vitals:   10/01/15 0815 10/01/15 1005  BP: 136/84 123/67  Pulse: 84   Temp:  36.1 C  Resp:  12    Complications: No apparent anesthesia complications

## 2015-10-01 NOTE — Discharge Summary (Signed)
OB Discharge Summary  Patient Name: Christina [name] DOB: 04-Sep-1985 MRN: [id number]  Date of admission: 09/30/2015 Delivering MD: Thomasene Mohair, MD Date of Delivery: 10/01/2015  Date of discharge: 10/03/2015  Admitting diagnosis:  1) intrauterine pregnancy at [redacted]w[redacted]d  2) Spontaneous rupture of membranes 3) history of prior cesarean delivery, desires Trial of labor 4) obesity affecting pregnancy, third trimester 5) Body mass index is 33.67 kg/(m^2).   Intrauterine pregnancy: [redacted]w[redacted]d      Secondary diagnosis: TOLAC     Discharge diagnosis: Term Pregnancy Delivered                                                                                                Post partum procedures: none  Augmentation: Pitocin  Complications: None  Hospital course:  Induction of Labor With Cesarean Section  30 y.o. yo G2P1001 at [redacted]w[redacted]d was admitted to the hospital 09/30/2015 for spontaneous rupture of membranes. She desired a TOLAC.  She was given pitocin augmentation after she did not spontaneously labor.  She progressed to 4cm dilation.  The fetal heart tracing became concerning with prolonged late decelerations and minimal variability. Given her history of cesarean delivery and that she was remote from vaginal delivery, she was taken to the operating room for delivery as above.  The patient went for cesarean section due to Non-Reassuring FHR, and delivered a Viable infant. APGARs were 4 at one minute, 5 at 5 minutes, and 9 at 10 minutes.  The pediatric team was present and stated that the reason for low APGARs was due to thick mucus in the fetal nares and the baby was vigorous at the time of delivery.  Once the nares were cleared the fetus became much more active.  Pediatrics declined cord blood gases.  Membrane Rupture Time/Date: )2:50 AM ,09/30/2015   Details of operation can be found in separate operative Note.  Patient had an uncomplicated postpartum course. She is ambulating, tolerating a regular diet,  passing flatus, and urinating well.  Patient is discharged home in stable condition on 10/03/2015.   She requested to be discharged home.                          Physical exam  Filed Vitals:   10/02/15 0744 10/02/15 1218 10/02/15 1654 10/02/15 1915  BP: 135/84 125/80  133/74  Pulse: 75 70  65  Temp: 98.1 F (36.7 C) 98 F (36.7 C) 98.2 F (36.8 C) 98.7 F (37.1 C)  TempSrc: Oral Oral Oral Oral  Resp: Height:      Weight:      SpO2: 99%   100%   General: NAD, breastfeeding Lochia: minimal Uterine Fundus: firm below umbilicus Incision: c/d/i, on-q pump in place DVT Evaluation: LEs non-tender to palpation  Labs: Lab Results  Component Value Date   WBC 12.5* 10/02/2015   HGB 10.1* 10/02/2015   HCT 29.8* 10/02/2015   MCV 94.2 10/02/2015   PLT 180 10/02/2015   CMP Latest Ref Rng 11/05/2013  Glucose 65-99 mg/dL 82(N)  BUN 5-62 mg/dL 9  Creatinine 0.60-1.30 mg/dL 4.78(G0.50(L)  Sodium 956-213136-145 mmol/L 135(L)  Potassium 3.5-5.1 mmol/L 3.3(L)  Chloride 98-107 mmol/L 104  CO2 21-32 mmol/L 22  Calcium 8.5-10.1 mg/dL 8.8  AST 12-6513-37 Unit/L 18    Discharge instruction: per After Visit Summary.  Medications:    Medication List    TAKE these medications        multivitamin with minerals tablet  Take 1 tablet by mouth daily.        Diet: routine diet  Activity: Advance as tolerated. Pelvic rest for 6 weeks.   Outpatient follow up: No Follow-up on file.  Postpartum contraception: TBD Rhogam Given postpartum: n/a Rubella vaccine given postpartum: no Varicella vaccine given postpartum: no TDaP given antepartum or postpartum: TDaP given AP Flu vaccine given antepartum or postpartum: n/a  Newborn Data: Live born female  Birth Weight: 7 lb 13.6 oz (3560 g) APGAR: 4, 5, 9    Baby Feeding: breast  Disposition:home with mother  SIGNED:  ----- Ranae Plumberhelsea Jeremaine Maraj, MD Attending Obstetrician and Gynecologist [facility]

## 2015-10-01 NOTE — Op Note (Addendum)
Cesarean Section Procedure Note   Randie M Seaborn   10/01/2015   Pre-operative Diagnosis:  1) intrauterine pregnancy at [redacted]w[redacted]d  2) SROM 3) Failed VBAC 4) history of prior cesarean delivery  Post-operative Diagnosis: 1) intrauterine pregnancy at [redacted]w[redacted]d  2) SROM 3) Failed VBAC 4) history of prior cesarean delivery  Procedure: Repeat Cesarean Delivery via Pfannenstiel incision with double layer closure  Surgeon: Surgeon(s) and Role:    * Conard Novak, MD - Primary    * [place] Bing, MD - Assisting   Anesthesia: epidural   Findings:  1) normal appearing gravid uterus, fallopian tubes, and ovaries 2) viable female infant with weight of 3,560 grams and APGARs 4 at 1 minute, 5 at 5 minutes and 9 at 10 minutes (pediatrics declined cord blood gases)   Estimated Blood Loss: 800 mL  Total IV Fluids: 1,000 ml   Urine Output: 450 mL clear urine  Specimens: None  Complications: no complications  Disposition: PACU - hemodynamically stable.   Maternal Condition: stable   Baby condition / location:  Couplet care / Skin to Skin  Procedure Details:  The patient was seen in the Holding Room. The risks, benefits, complications, treatment options, and expected outcomes were discussed with the patient. The patient concurred with the proposed plan, giving informed consent. identified as Armie M Sabala and the procedure verified as C-Section Delivery. A Time Out was held and the above information confirmed.   After induction of anesthesia, the patient was draped and prepped in the usual sterile manner. A Pfannenstiel incision was made and carried down through the subcutaneous tissue to the fascia. Fascial incision was made and extended transversely. The fascia was separated from the underlying rectus tissue superiorly and inferiorly. The peritoneum was identified and entered. Peritoneal incision was extended longitudinally. The bladder flap was not bluntly freed from the lower uterine  segment. A low transverse uterine incision was made and the hysterotomy was extended with cranial-caudal tension. Delivered from cephalic presentation was a 3,560 gram Living newborn infant(s) or Female with Apgar scores of 4 at one minute and 5 at five minutes and 9 at 10 minutes. Cord ph was not sent, though a segment of cord was saved.  The pediatric team declined cord blood gases given that the reason for low APGARs was due to a thick mucus collection in the fetal nares and not respiratory depression.  The umbilical cord was clamped and cut.  Cord blood was obtained for evaluation. The placenta was removed Intact and appeared normal. The uterine outline, tubes and ovaries appeared normal. The uterine incision was closed with running locked sutures of 0 Vicryl.  A second layer of the same suture was thrown in an imbricating fashion.  Hemostasis was assured.  The uterus was retained to the abdomen and the paracolic gutters were cleared of all clots and debris.  The rectus muscles were inspected and found to be hemostatic.  The On-Q catheter pumps were inserted in accordance with the manufacturer's recommendations.  The catheters were inserted approximately 4cm cephelad to the incision line, approximately 1cm apart, straddling the midline.  They were inserted to a depth of the 4th mark. They were positioned superficial to the rectus abdominus muscles and deep to the rectus fascia.    The fascia was then reapproximated with running sutures of 1-0 PDS, looped. The subcutaneous tissue was reapproximated using 2-0 plain gut such that no greater than 2cm of dead space remained. The subcuticular closure was performed using 4-0 monocryl. The  skin closure was reinforced using surgical skin glue.  The On-Q catheters were bolused with 5 mL of 0.5% marcaine plain for a total of 10 mL.  The catheters were affixed to the skin with surgical skin glue, steri-strips, and tegaderm.    Instrument, sponge, and needle counts  were correct prior the abdominal closure and were correct at the conclusion of the case.  The patient received Ancef 2 gram IV prior to skin incision (within 30 minutes) and Azithromycin $RemoveBefor [id number]$500mge was wearing SCDs throughout the case.   Signed: Conard NovakStephen D. Mayana Irigoyen, MD 10/01/2015 9:58 AM

## 2015-10-01 NOTE — Anesthesia Procedure Notes (Signed)
Epidural Patient location during procedure: OB Start time: 10/01/2015 5:20 AM End time: 10/01/2015 5:30 AM  Staffing Anesthesiologist: Lenard SimmerKARENZ, Jamarien Rodkey Performed by: anesthesiologist   Preanesthetic Checklist Completed: patient identified, site marked, surgical consent, pre-op evaluation, timeout performed, IV checked, risks and benefits discussed and monitors and equipment checked  Epidural Patient position: sitting Prep: ChloraPrep Patient monitoring: heart rate, continuous pulse ox and blood pressure Approach: midline Location: L3-L4 Injection technique: LOR saline  Needle:  Needle type: Tuohy  Needle gauge: 17 G Needle length: 9 cm and 9 Needle insertion depth: 5 cm Catheter type: closed end flexible Catheter size: 19 Gauge Catheter at skin depth: 9.5 cm Test dose: negative and 1.5% lidocaine with Epi 1:200 K  Assessment Sensory level: T8 Events: blood not aspirated, injection not painful, no injection resistance, negative IV test and no paresthesia  Additional Notes   Patient tolerated the insertion well without immediate complications.Reason for block:procedure for pain

## 2015-10-01 NOTE — Anesthesia Preprocedure Evaluation (Signed)
Anesthesia Evaluation  Patient identified by MRN, date of birth, ID band Patient awake    Reviewed: Allergy & Precautions, H&P , NPO status , Patient's Chart, lab work & pertinent test results, reviewed documented beta blocker date and time   History of Anesthesia Complications Negative for: history of anesthetic complications  Airway Mallampati: I  TM Distance: >3 FB Neck ROM: full    Dental no notable dental hx. (+) Teeth Intact   Pulmonary neg pulmonary ROS, former smoker,    Pulmonary exam normal breath sounds clear to auscultation       Cardiovascular Exercise Tolerance: Good negative cardio ROS Normal cardiovascular exam Rhythm:regular Rate:Normal     Neuro/Psych negative neurological ROS  negative psych ROS   GI/Hepatic negative GI ROS, Neg liver ROS,   Endo/Other  negative endocrine ROS  Renal/GU negative Renal ROS  negative genitourinary   Musculoskeletal   Abdominal   Peds  Hematology negative hematology ROS (+)   Anesthesia Other Findings History reviewed. No pertinent past medical history.   Reproductive/Obstetrics (+) Pregnancy                             Anesthesia Physical Anesthesia Plan  ASA: II  Anesthesia Plan: Epidural   Post-op Pain Management:    Induction:   Airway Management Planned:   Additional Equipment:   Intra-op Plan:   Post-operative Plan:   Informed Consent: I have reviewed the patients History and Physical, chart, labs and discussed the procedure including the risks, benefits and alternatives for the proposed anesthesia with the patient or authorized representative who has indicated his/her understanding and acceptance.   Dental Advisory Given  Plan Discussed with: Anesthesiologist, CRNA and Surgeon  Anesthesia Plan Comments:         Anesthesia Quick Evaluation

## 2015-10-01 NOTE — Progress Notes (Signed)
Notified that patient had a prolonged deceleration at about 6am this morning to 70s-80s. Return to baseline but now with minimal variability and ? Late decelerations.  Patient has made minimal cervical change recently and given her fetal tracing, will move forward with repeat cesarean delivery.  Discussed with patient and she is amenable to plan.  Discussed risks/benefits/alternatives with patient and she voiced understanding.   Thomasene MohairStephen Nazifa Trinka, MD 10/01/2015 8:24 AM

## 2015-10-01 NOTE — Progress Notes (Signed)
L&D Note  10/01/2015 - 7:44 AM  29 y.o. G2P1 8564w3d   [name] is admitted for PROM   Subjective:  Feeling comfortable after epidural  Objective:   Filed Vitals:   10/01/15 0704 10/01/15 0709 10/01/15 0714 10/01/15 0715  BP:    126/79  Pulse:    70  Temp:    98.2 F (36.8 C)  TempSrc:    Oral  Resp:    16  Height:      Weight:      SpO2: 97% 98% 98%     Current Vital Signs 24h Vital Sign Ranges  T 98.2 F (36.8 C) Temp  Avg: 98.1 F (36.7 C)  Min: 97.8 F (36.6 C)  Max: 98.3 F (36.8 C)  BP 126/79 mmHg BP  Min: 113/73  Max: 153/85  HR 70 Pulse  Avg: 74.4  Min: 59  Max: 98  RR 16 Resp  Avg: 16  Min: 16  Max: 16  SaO2 98 % Not Delivered SpO2  Avg: 97.3 %  Min: 94 %  Max: 100 %       24 Hour I/O Current Shift I/O  Time Ins Outs 05/17 0701 - 05/18 0700 In: 875 [I.V.:875] Out: -       FHR: baseline 150 with minimal variability. Decelerations noted at 0600 this am. Several late decelerations around 0700 noted, resolved with position changes.  Toco: IUPC just placed.  SVE: 4/80/-1   Assessment :  IUP at 38w 3d, PROM, TOLAC  Plan:  IUPC placed.  Dr Jean RosenthalJackson notified to review strip and of pt's status.    Christina [name], Christina [name], [place]

## 2015-10-02 ENCOUNTER — Encounter: Payer: Self-pay | Admitting: Registered Nurse

## 2015-10-02 ENCOUNTER — Encounter: Payer: Self-pay | Admitting: Obstetrics and Gynecology

## 2015-10-02 LAB — CBC
HEMATOCRIT: 29.8 % — AB (ref 35.0–47.0)
HEMOGLOBIN: 10.1 g/dL — AB (ref 12.0–16.0)
MCH: 31.7 pg (ref 26.0–34.0)
MCHC: 33.7 g/dL (ref 32.0–36.0)
MCV: 94.2 fL (ref 80.0–100.0)
PLATELETS: 180 10*3/uL (ref 150–440)
RBC: 3.17 MIL/uL — AB (ref 3.80–5.20)
RDW: 13.5 % (ref 11.5–14.5)
WBC: 12.5 10*3/uL — ABNORMAL HIGH (ref 3.6–11.0)

## 2015-10-02 MED ORDER — IBUPROFEN 600 MG PO TABS
600.0000 mg | ORAL_TABLET | Freq: Four times a day (QID) | ORAL | Status: DC
  Administered 2015-10-02 (×2): 600 mg via ORAL
  Filled 2015-10-02 (×2): qty 1

## 2015-10-02 NOTE — Anesthesia Postprocedure Evaluation (Signed)
Anesthesia Post Note  Patient: Christina [name]  Procedure(s) Performed: Procedure(s) (LRB): CESAREAN SECTION (N/A)  Patient location during evaluation: Mother Baby Anesthesia Type: Epidural Level of consciousness: awake and alert Pain management: pain level controlled Vital Signs Assessment: post-procedure vital signs reviewed and stable Respiratory status: spontaneous breathing, nonlabored ventilation and respiratory function stable Cardiovascular status: stable Postop Assessment: no headache, no backache and epidural receding Anesthetic complications: no    Last Vitals:  Filed Vitals:   10/02/15 0038 10/02/15 0357  BP: 125/87 121/72  Pulse: 64 71  Temp: 36.5 C 36.9 C  Resp: 20 20    Last Pain:  Filed Vitals:   10/02/15 0357  PainSc: 0-No pain                 Starling Mannsurtis,  Aranza Geddes A

## 2015-10-02 NOTE — Progress Notes (Addendum)
Cesarean post op day 1 Subjective:   Doing well, ambulating well, tolerating regular PO diet, foley still in place, tolerating pain with PO meds.  NO CP, SOB, Fever/chills, nausea/vomiting, or calf pain.   Objective:  Blood pressure 135/84, pulse 75, temperature 98.1 F (36.7 C), temperature source Oral, resp. rate 20, height 5\' 3"  (1.6 m), weight 86.183 kg (190 lb), SpO2 99 %, unknown if currently breastfeeding.  General: NAD Pulmonary: no increased work of breathing Abdomen: non-distended, non-tender, fundus firm at level of umbilicus Incision: Extremities: no edema, no erythema, no tenderness  Results for orders placed or performed during the hospital encounter of 09/30/15 (from the past 24 hour(s))  CBC     Status: Abnormal   Collection Time: 10/02/15  5:07 AM  Result Value Ref Range   WBC 12.5 (H) 3.6 - 11.0 K/uL   RBC 3.17 (L) 3.80 - 5.20 MIL/uL   Hemoglobin 10.1 (L) 12.0 - 16.0 g/dL   HCT 86.529.8 (L) 78.435.0 - 69.647.0 %   MCV 94.2 80.0 - 100.0 fL   MCH 31.7 26.0 - 34.0 pg   MCHC 33.7 32.0 - 36.0 g/dL   RDW 29.513.5 28.411.5 - 13.214.5 %   Platelets 180 150 - 440 K/uL    Intake/Output Summary (Last 24 hours) at 10/02/15 [id number] Last data filed at 10/02/15 0600  Gross per 24 hour  Intake    300 ml  Output   2925 ml  Net  -2625 ml      Assessment:   29 y.o. G2P1001 postoperative day #1   Plan:  1) Acute blood loss anemia - hemodynamically stable and asymptomatic - po ferrous sulfate  2) Remove foley, advance diet as tolerated, encourage OOB.  Routine post op cares.  3) TDAP UTD  4) Breastfeeding  5) Disposition: continue inpatient care postop.   ----- Ranae Plumberhelsea Jolicia Delira, MD Attending Obstetrician and Gynecologist [facility]

## 2015-10-02 NOTE — Anesthesia Post-op Follow-up Note (Signed)
  Anesthesia Pain Follow-up Note  Patient: Christina [name]  Day #: 1  Date of Follow-up: 10/02/2015 Time: 7:26 AM  Last Vitals:  Filed Vitals:   10/02/15 0038 10/02/15 0357  BP: 125/87 121/72  Pulse: 64 71  Temp: 36.5 C 36.9 C  Resp: 20 20    Level of Consciousness: alert  Pain: none   Side Effects:None  Catheter Site Exam:clean, dry, no drainage  Plan: D/C from anesthesia care  Starling Mannsurtis,  Paarth Cropper A

## 2015-10-02 NOTE — Discharge Instructions (Signed)

## 2015-10-03 NOTE — Progress Notes (Deleted)
Patient discharged to home with infant. Patient left 3rd floor via w/c with infant in her arms accompanied by Arnoldo Hookerterri Ervey Fallin RN. Patient left [facility] secured with seat belt in car accompanied by her husband and infant and 2 other sons,

## 2015-10-03 NOTE — Progress Notes (Signed)
Patient v/u of all dr's discharge instructions; copy given. Patient left [facility] with infant via car secured with seatbelt accompanied by husband and 2 other sons.

## 2015-10-09 ENCOUNTER — Inpatient Hospital Stay: Admission: RE | Admit: 2015-10-09 | Payer: BC Managed Care – PPO | Source: Ambulatory Visit

## 2015-10-13 ENCOUNTER — Encounter: Admission: RE | Payer: Self-pay | Source: Ambulatory Visit

## 2015-10-13 ENCOUNTER — Inpatient Hospital Stay
Admission: RE | Admit: 2015-10-13 | Payer: BC Managed Care – PPO | Source: Ambulatory Visit | Admitting: Obstetrics and Gynecology

## 2015-10-13 SURGERY — Surgical Case
Anesthesia: Choice

## 2015-10-20 ENCOUNTER — Encounter: Payer: Self-pay | Admitting: *Deleted

## 2015-10-27 ENCOUNTER — Encounter: Payer: Self-pay | Admitting: General Surgery

## 2015-10-27 ENCOUNTER — Ambulatory Visit (INDEPENDENT_AMBULATORY_CARE_PROVIDER_SITE_OTHER): Payer: BC Managed Care – PPO | Admitting: General Surgery

## 2015-10-27 VITALS — BP 122/78 | HR 62 | Resp 12 | Ht 63.0 in | Wt 166.0 lb

## 2015-10-27 DIAGNOSIS — I83811 Varicose veins of right lower extremities with pain: Secondary | ICD-10-CM

## 2015-10-27 NOTE — Patient Instructions (Signed)
Patient educated on clot formation risks with long periods of travel with periods of sitting with limited movement and the importance of compression hose.  Increased risk with pregnancy.

## 2015-10-27 NOTE — Progress Notes (Signed)
Patient ID: Christina [name], female   DOB: 06/15/1985, 30 y.o.   MRN: [id number] Following up here for her varicose veins. She states they seem to be much better. She is s/p caesarean section 10/01/15. Previously prominent vein in the thigh and calf area on right appear to have resolved and the burning she had in the outer right thigh has also resolved.  Patient reports decreased pain with walking . I have reviewed the history of present illness with the patient.  Marked improvement on exam with barely visible varicose veins and nontender to palpation.  Legs with no edema, skin changes. Pulses intact and equal bilaterally.  No increased filling of veins on dependency.   Patient educated on clot formation risks with long periods of travel with periods of sitting with limited movement and the importance of compression hose.  Increased risk with pregnancy.  No need for ultrasound today. Call if any recurrence or new symptoms develop in the legs.

## 2016-01-15 ENCOUNTER — Encounter: Payer: Self-pay | Admitting: Pediatrics

## 2016-09-30 IMAGING — US US EXTREM LOW VENOUS*R*
1 series · 14 of 24 positions shown · non-contrast
Comparison: None

CLINICAL DATA: Right leg swelling x3 weeks.  Varicose veins.

EXAM:
RIGHT LOWER EXTREMITY VENOUS DOPPLER ULTRASOUND
TECHNIQUE: Gray-scale sonography with compression, as well as color and duplex
ultrasound, were performed to evaluate the deep venous system from
the level of the common femoral vein through the popliteal and
proximal calf veins.

[Series 1: us extrem low venous*right* · 0.08mm/px · 14 of 48 slices shown]
[im 1/48]
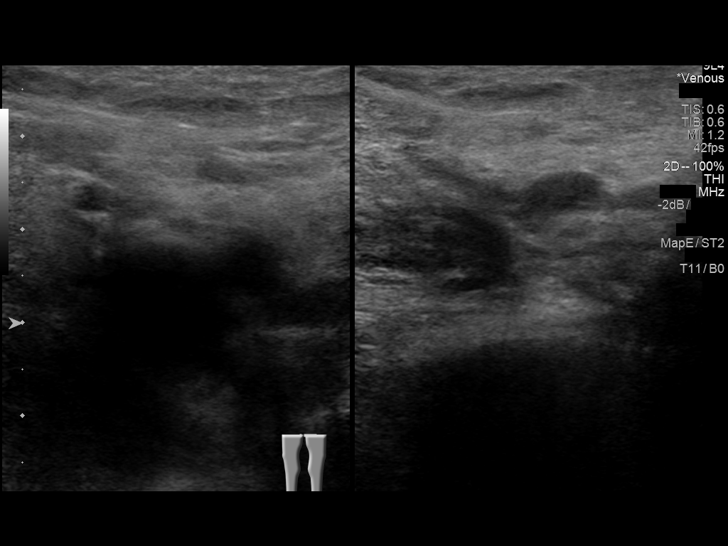
[im 5/48]
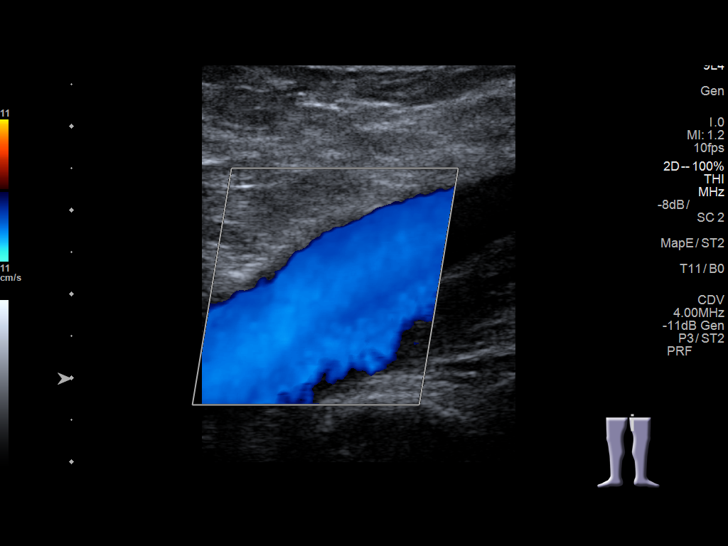
[im 9/48]
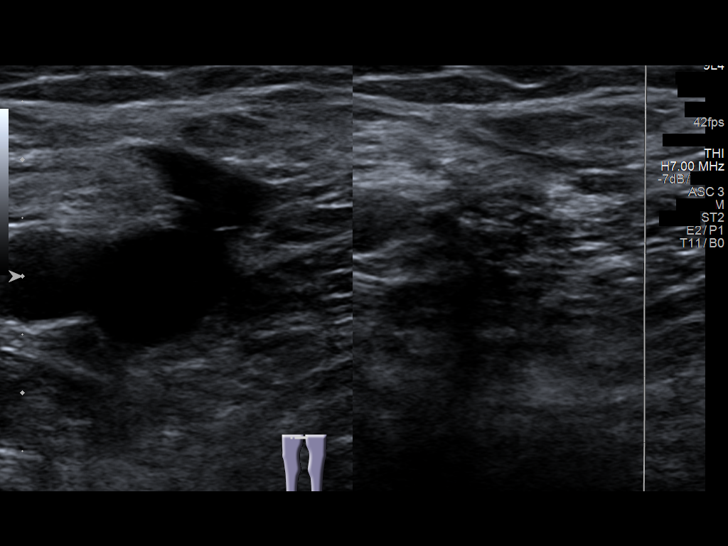
[im 13/48]
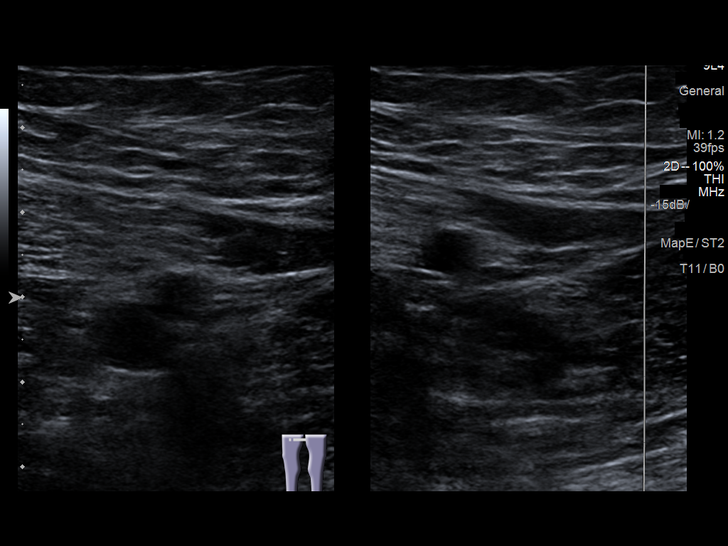
[im 15/48]
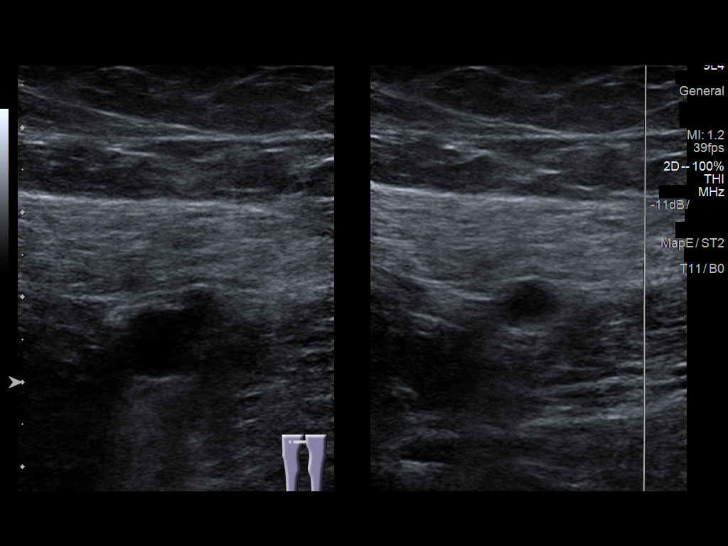
[im 19/48]
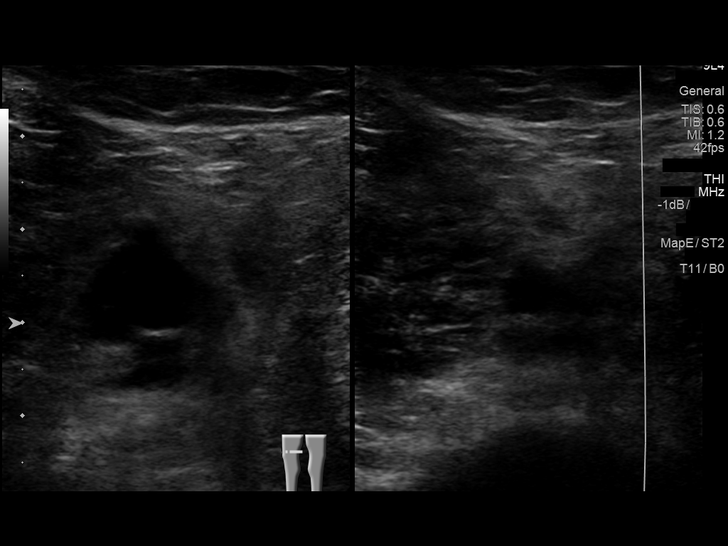
[im 23/48]
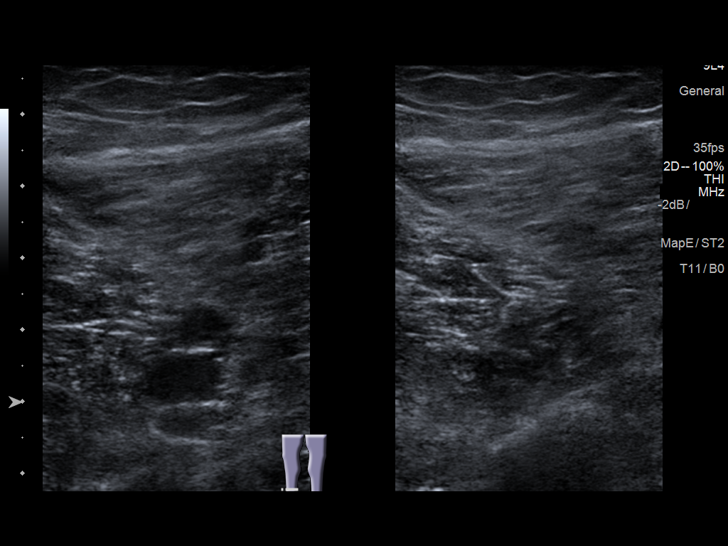
[im 25/48]
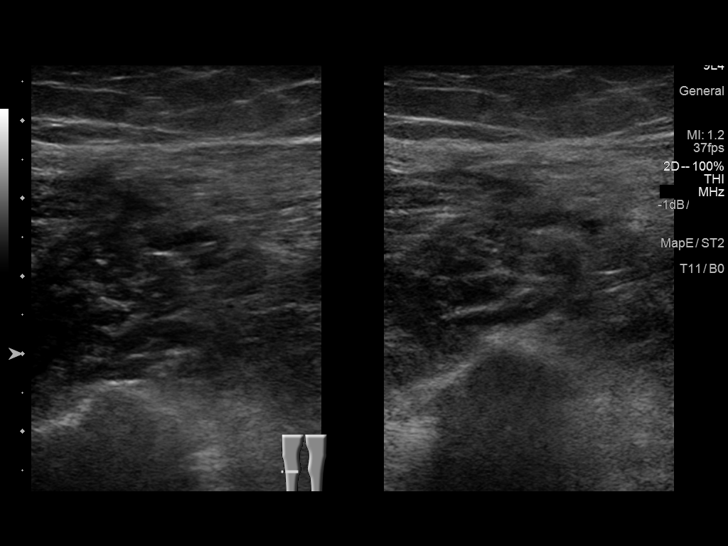
[im 29/48]
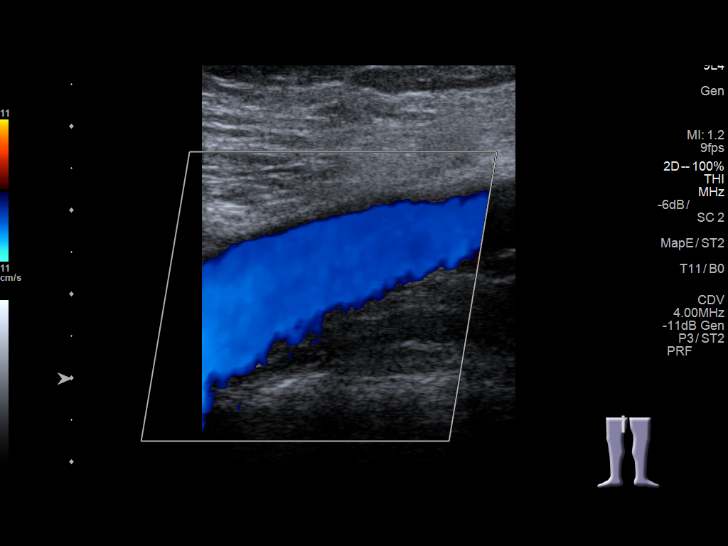
[im 33/48]
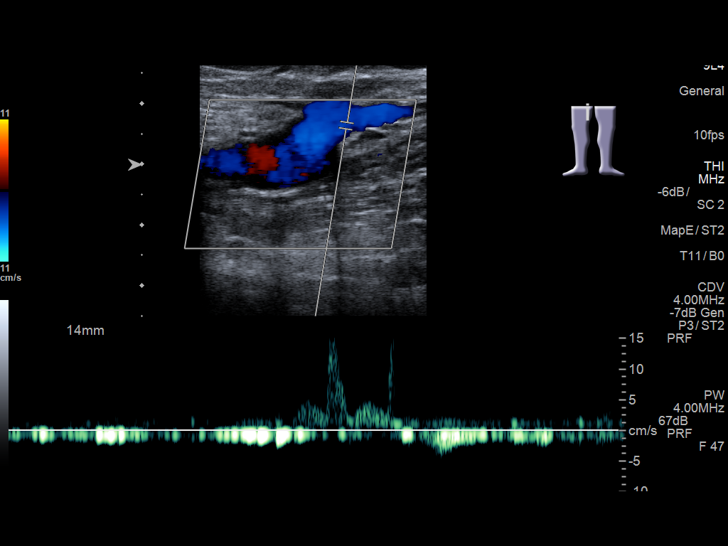
[im 37/48]
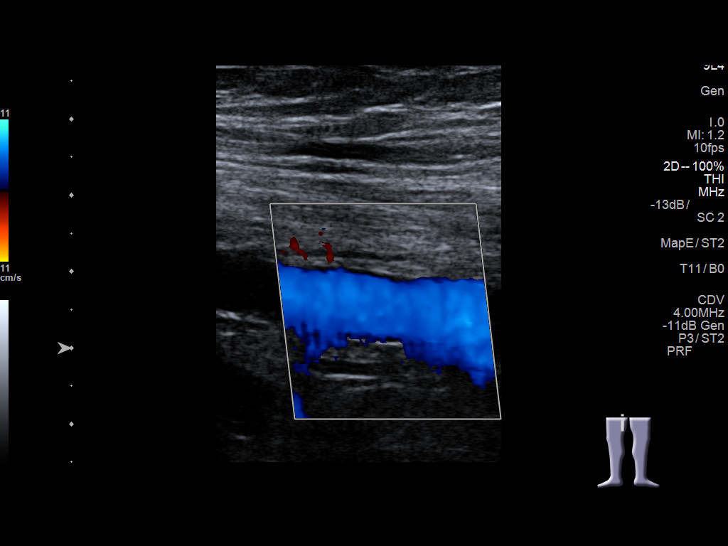
[im 39/48]
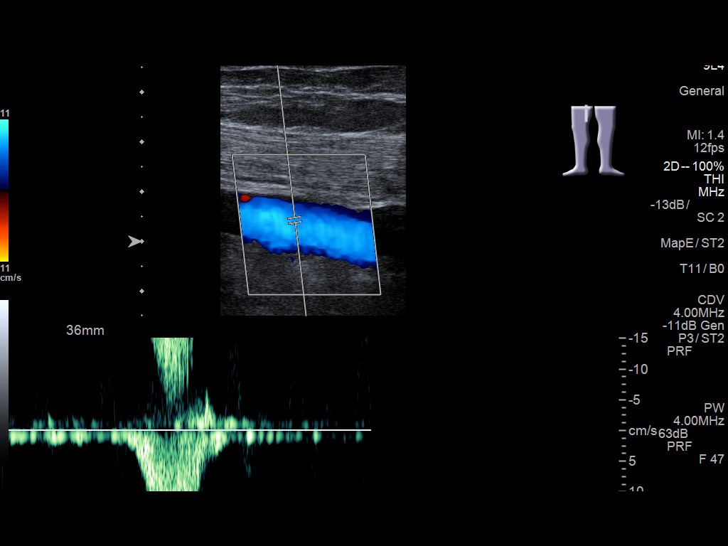
[im 43/48]
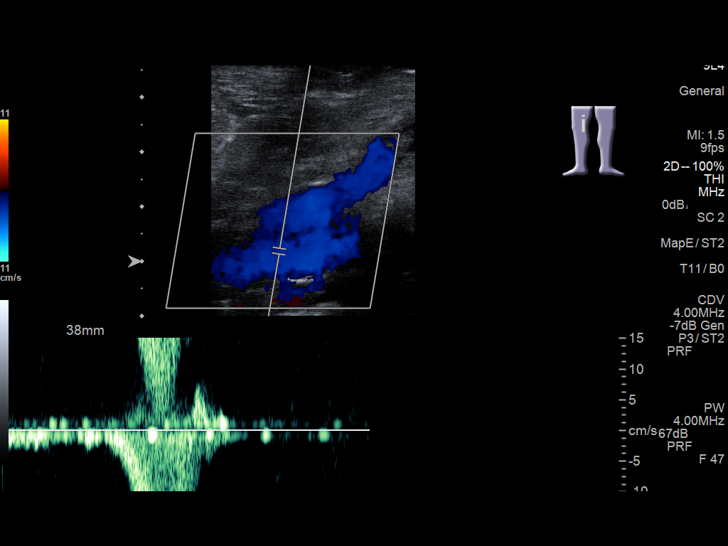
[im 48/48]
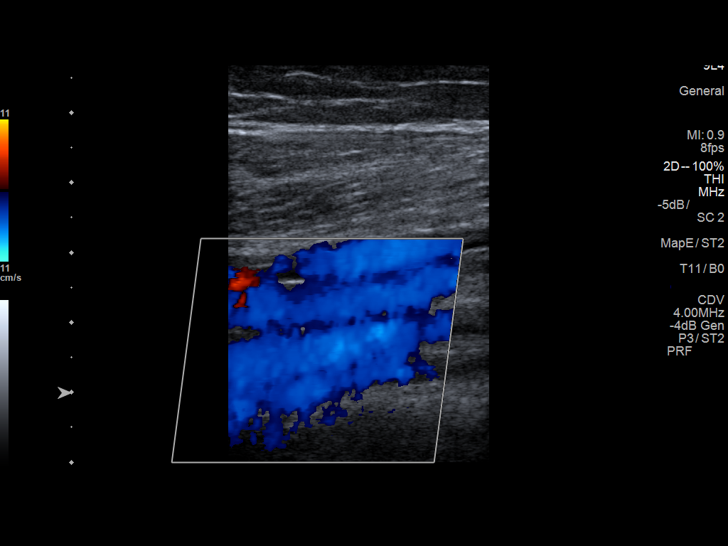

[14 of 24 positions shown; findings below may reference images not displayed]

FINDINGS: Normal compressibility of the common femoral, superficial femoral,
and popliteal veins, as well as the proximal calf veins. No filling
defects to suggest DVT on grayscale or color Doppler imaging.
Doppler waveforms show normal direction of venous flow, normal
respiratory phasicity and response to augmentation. A reflux study
of the superficial venous system not performed. Survey views of the
contralateral common femoral vein are unremarkable.
IMPRESSION: 1. No evidence of lower extremity deep vein thrombosis, RIGHT.

## 2017-01-13 ENCOUNTER — Encounter: Payer: Self-pay | Admitting: Advanced Practice Midwife

## 2017-01-13 ENCOUNTER — Ambulatory Visit (INDEPENDENT_AMBULATORY_CARE_PROVIDER_SITE_OTHER): Payer: BC Managed Care – PPO | Admitting: Advanced Practice Midwife

## 2017-01-13 VITALS — BP 120/90 | HR 81 | Ht 63.0 in | Wt 192.0 lb

## 2017-01-13 DIAGNOSIS — Z98891 History of uterine scar from previous surgery: Secondary | ICD-10-CM

## 2017-01-13 DIAGNOSIS — R102 Pelvic and perineal pain: Secondary | ICD-10-CM

## 2017-01-13 DIAGNOSIS — Z975 Presence of (intrauterine) contraceptive device: Secondary | ICD-10-CM

## 2017-01-13 NOTE — Progress Notes (Signed)
S: The patient is here today for complaint of severe low pelvic pain that has happened 3 times in the last 9 months. She is s/p c/section May 2017. She had and IUD placed in July of 2017. In January of this year she had a stabbing pain in her lower abdomen that was located around her incision site. She said it felt like "something needed to come out" and when she sat on the toilet it felt like everything was pushing up into her abdomen up to her ribs. This pain lasted for about an hour and a half and then subsided. Her abdomen was tender following this event for a couple of days. Last week she had a similar occurrence except that it was milder. Then on Wednesday of this week the pain happened again. This time it lasted for about 2 hours and yesterday she felt abdominal tenderness and bloating. She had intercourse prior to the last episode but otherwise she knows of no aggravating factors. She took Ibuprofen which helped some with the pain. She admits to regular bowel and bladder function. She denies dietary changes or food sensitivities. She has rare spotting since having the IUD.   O: Vital Signs: BP 120/90 (BP Location: Left Arm, Patient Position: Sitting, Cuff Size: Normal)   Pulse 81   Ht 5\' 3"  (1.6 m)   Wt 192 lb (87.1 kg)   BMI 34.01 kg/m  Constitutional: Well nourished, well developed female in no acute distress.  HEENT: normal Skin: Warm and dry.  Cardiovascular: Regular rate and rhythm.   Respiratory: Clear to auscultation bilateral. Normal respiratory effort Abdomen: soft, mildly tender, nondistended, no abnormal masses, no epigastric pain Back: no CVAT Psych: Alert and Oriented x3. No memory deficits. Normal mood and affect.  MS: normal gait, normal bilateral lower extremity ROM/strength/stability.  Pelvic exam:  is not limited by body habitus EGBUS: within normal limits Vagina: within normal limits and with normal mucosa  Cervix: no speculum exam, strings felt Uterus: mildly tender  to palpation, no masses, not enlarged Adnexa: no tenderness to palpation  A: 31 yo female with pelvic pain, possible scar tissue from surgery, possible mal-placement of IUD, other uterine pathology, or possible gastric upset  P: Gyn u/s for uterine evaluation, IUD placement.  F/U already scheduled with Dr Jean RosenthalJackson on 9/11 Heat, ibuprofen to affected area if pain recurs   Tresea MallJane Neville Pauls, CNM

## 2017-01-24 ENCOUNTER — Other Ambulatory Visit: Payer: Self-pay

## 2017-01-24 ENCOUNTER — Ambulatory Visit: Payer: Self-pay | Admitting: Obstetrics and Gynecology

## 2017-02-08 ENCOUNTER — Ambulatory Visit (INDEPENDENT_AMBULATORY_CARE_PROVIDER_SITE_OTHER): Payer: Medicaid Other | Admitting: Obstetrics and Gynecology

## 2017-02-08 ENCOUNTER — Inpatient Hospital Stay (INDEPENDENT_AMBULATORY_CARE_PROVIDER_SITE_OTHER): Payer: Medicaid Other

## 2017-02-08 ENCOUNTER — Encounter: Payer: Self-pay | Admitting: Obstetrics and Gynecology

## 2017-02-08 VITALS — BP 124/70 | Ht 62.0 in | Wt 194.0 lb

## 2017-02-08 DIAGNOSIS — Z1339 Encounter for screening examination for other mental health and behavioral disorders: Secondary | ICD-10-CM

## 2017-02-08 DIAGNOSIS — R102 Pelvic and perineal pain: Secondary | ICD-10-CM

## 2017-02-08 DIAGNOSIS — Z0000 Encounter for general adult medical examination without abnormal findings: Secondary | ICD-10-CM

## 2017-02-08 DIAGNOSIS — Z124 Encounter for screening for malignant neoplasm of cervix: Secondary | ICD-10-CM

## 2017-02-08 DIAGNOSIS — Z3009 Encounter for other general counseling and advice on contraception: Secondary | ICD-10-CM

## 2017-02-08 DIAGNOSIS — Z1331 Encounter for screening for depression: Secondary | ICD-10-CM

## 2017-02-08 DIAGNOSIS — Z01419 Encounter for gynecological examination (general) (routine) without abnormal findings: Secondary | ICD-10-CM

## 2017-02-08 NOTE — Progress Notes (Signed)
Gynecology Annual Exam  PCP: [facility]  Chief Complaint  Patient presents with  . Annual Exam    History of Present Illness:  [name] is a 31 y.o. [id number] who LMP was No LMP recorded. Patient is not currently having periods (Reason: IUD)., presents today for her annual examination.  Her menses are absent  She is single partner, contraception - IUD.  Last Pap: 4 years ago, NILM Hx of STDs: none  Last mammogram: n/a There is no FH of breast cancer. There is no FH of ovarian cancer. The patient does not do self-breast exams.  Tobacco use: former smoker. Alcohol use: social drinker Exercise: not active  The patient wears seatbelts: yes.      The patient notably has had been dealing with her 98 month-old son who was diagnosed with SMA.  She and her husband have been coping well.  She presented to the clinic a few weeks ago with complaints of intermittent pelvic and vaginal pain that she states felt like a strong pressure/stabbing sensation.  The pain is rated as severe. It is located in her bilateral lower quadrants and radiates down to her vaginal area.  Nothing makes it better or worse. Denies inciting events.  The problem is intermittent.  She feels cold and clammy and sometimes hot with her symptoms.  She denies fevers, chills, nausea, vomiting, urinary symptoms, GI symptoms.  She does have episodes of dyspareunia.  She had a pelvic ultrasound today that showed a normal uterus with the IUD in the correct location. No pelvic masses cysts.   Past Medical History: denies  Past Surgical History:  Procedure Laterality Date  . CESAREAN SECTION  11-08-13  . CESAREAN SECTION N/A 10/01/2015   Procedure: CESAREAN SECTION;  Surgeon: Conard Novak, MD;  Location: [facility] ORS;  Service: Obstetrics;  Laterality: N/A;    Prior to Admission medications   Medication Sig Start Date End Date Taking? Authorizing Provider  levonorgestrel (MIRENA) 20 MCG/24HR IUD 1 each  by Intrauterine route once.   Yes [provider]   Allergies: No Known Allergies  Gynecologic History: No LMP recorded. Patient is not currently having periods (Reason: IUD).  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 Marital status: Married    Spouse name: N/A  . Number of children: N/A  . Years of education: N/A   Occupational History  . Not on file.   Social History Main Topics  . Smoking status: Former Smoker    Quit date: 05/16/2014  . Smokeless tobacco: Never Used  . Alcohol use No  . Drug use: No  . Sexual activity: Yes    Birth control/ protection: IUD   Other Topics Concern  . Not on file   Social History Narrative  . No narrative on file    Family History  Problem Relation Age of Onset  . Hypertension Father   . Diabetes Mellitus II Father   . Heart failure Father   . Hypertension Mother   . Heart disease Mother   . Diabetes Mellitus II Brother   . Breast cancer Maternal Grandmother   . Stroke Maternal Grandmother   . Lung cancer Paternal Grandmother   . Heart disease Paternal Grandfather     Review of Systems  Constitutional: Negative.   HENT: Negative.   Eyes: Negative.   Respiratory: Negative.   Cardiovascular: Negative.   Gastrointestinal: Negative.   Genitourinary: Negative.   Musculoskeletal:  Negative.   Skin: Negative.   Neurological: Negative.   Psychiatric/Behavioral: Negative.      Physical Exam BP 124/70   Ht  (1.575 m)   Wt 194 lb (88 kg)   BMI 35.48 kg/m    Physical Exam  Constitutional: She is oriented to person, place, and time. She appears well-developed and well-nourished. No distress.  Genitourinary: Vagina normal and uterus normal. Pelvic exam was performed with patient supine. There is no rash, tenderness or lesion on the right labia. There is no rash, tenderness or lesion on the left labia. Vagina exhibits no lesion. No tenderness or bleeding in the  vagina. No signs of injury around the vagina. Right adnexum does not display mass, does not display tenderness and does not display fullness. Left adnexum does not display mass, does not display tenderness and does not display fullness.  Cervix exhibits visible IUD strings. Cervix does not exhibit motion tenderness, lesion, discharge or polyp.   Uterus is mobile. Uterus is not enlarged, tender, exhibiting a mass or irregular (is regular).  HENT:  Head: Normocephalic and atraumatic.  Eyes: EOM are normal. No scleral icterus.  Neck: Normal range of motion. Neck supple. No thyromegaly present.  Cardiovascular: Normal rate and regular rhythm.  Exam reveals no gallop and no friction rub.   No murmur heard. Pulmonary/Chest: Effort normal and breath sounds normal. No respiratory distress. She has no wheezes. She has no rales. Right breast exhibits no inverted nipple, no mass, no nipple discharge, no skin change and no tenderness. Left breast exhibits no inverted nipple, no mass, no nipple discharge, no skin change and no tenderness.  Abdominal: Soft. Bowel sounds are normal. She exhibits no distension and no mass. There is no tenderness. There is no rebound and no guarding.  Musculoskeletal: Normal range of motion. She exhibits no edema.  Lymphadenopathy:    She has no cervical adenopathy.  Neurological: She is alert and oriented to person, place, and time. No cranial nerve deficit.  Skin: Skin is warm and dry. No erythema.  Psychiatric: She has a normal mood and affect. Her behavior is normal. Judgment normal.   Female chaperone present for pelvic and breast  portions of the physical exam  Results: AUDIT Questionnaire (screen for alcoholism): 1 PHQ-9: 3  Assessment: 31 y.o. [id number] female here for routine annual gynecologic examination.  Plan: Problem List Items Addressed This Visit    None    Visit Diagnoses    Women's annual routine gynecological examination    -  Primary   Relevant  Orders   IGP,CtNg,AptimaHPV,rfx16/18,45   Pap smear for cervical cancer screening       Relevant Orders   IGP,CtNg,AptimaHPV,rfx16/18,45   Screening for depression       Screening for alcohol problem       Pelvic pain         Screening: -- Blood pressure screen normal -- Colonoscopy - not due -- Mammogram - not due -- Weight screening: obese: discussed management options, including lifestyle, dietary, and exercise. -- Depression screening negative (PHQ-9) -- Nutrition: normal -- cholesterol screening: not due for screening -- osteoporosis screening: not due -- tobacco screening: not using -- alcohol screening: AUDIT questionnaire indicates low-risk usage. -- family history of breast cancer screening: done. not at high risk. -- no evidence of domestic violence or intimate partner violence. -- STD screening: gonorrhea/chlamydia NAAT collected -- pap smear collected per ASCCP guidelines -- HPV vaccination series: not eligilbe  Pelvic Pain: discussed multiple possible etiologies:  Some of those include adhesive disease, her IUD causing irritative symptoms, non-gynecologic issues. I offered to remove her IUD. However, I understand that she would like to have another form of contraception in place, if her IUD is removed. Discussed multiple other forms. She would like to consider, but will keep her IUD in for now. I discussed that if she is ever in as much pain as she was recently (describes being nearly incapacitated for 3 hours), she should consider work up in the Emergency Department as I am not certain of the etiology of her pain.   Thomasene Mohair, MD 02/08/2017 4:22 PM

## 2017-02-11 LAB — IGP,CTNG,APTIMAHPV,RFX16/18,45
CHLAMYDIA, NUC. ACID AMP: NEGATIVE
GONOCOCCUS BY NUCLEIC ACID AMP: NEGATIVE
HPV APTIMA: NEGATIVE
PAP Smear Comment: 0

## 2017-02-14 ENCOUNTER — Encounter: Payer: Self-pay | Admitting: Obstetrics and Gynecology

## 2019-08-04 ENCOUNTER — Ambulatory Visit: Payer: BC Managed Care – PPO | Attending: Internal Medicine

## 2019-08-04 DIAGNOSIS — Z23 Encounter for immunization: Secondary | ICD-10-CM

## 2019-08-04 NOTE — Progress Notes (Signed)
   Covid-19 Vaccination Clinic  Name:  Christina [name]    MRN: [id number] DOB: [birth year]  08/04/2019  [name] was observed post Covid-19 immunization for 15 minutes without incident. She was provided with Vaccine Information Sheet and instruction to access the V-Safe system.   [name] was instructed to call 911 with any severe reactions post vaccine: Marland Kitchen Difficulty breathing  . Swelling of face and throat  . A fast heartbeat  . A bad rash all over body  . Dizziness and weakness   Immunizations Administered    Name Date Dose VIS Date Route   Pfizer COVID-19 Vaccine 08/04/2019  3:30 PM 0.3 mL 04/26/2019 Intramuscular   Manufacturer: [employer], [employer]   Lot: [id number]   NDC: [address]-0

## 2019-08-15 ENCOUNTER — Ambulatory Visit: Payer: BC Managed Care – PPO

## 2019-08-25 ENCOUNTER — Ambulatory Visit: Payer: BC Managed Care – PPO | Attending: Internal Medicine

## 2019-08-25 DIAGNOSIS — Z23 Encounter for immunization: Secondary | ICD-10-CM

## 2019-08-25 NOTE — Progress Notes (Signed)
   Covid-19 Vaccination Clinic  Name:  Christina [name]    MRN: [id number] DOB: May 09, 1986  08/25/2019  [name] was observed post Covid-19 immunization for 15 minutes without incident. She was provided with Vaccine Information Sheet and instruction to access the V-Safe system.   [name] was instructed to call 911 with any severe reactions post vaccine: Marland Kitchen Difficulty breathing  . Swelling of face and throat  . A fast heartbeat  . A bad rash all over body  . Dizziness and weakness   Immunizations Administered    Name Date Dose VIS Date Route   Pfizer COVID-19 Vaccine 08/25/2019  3:13 PM 0.3 mL 04/26/2019 Intramuscular   Manufacturer: [employer], [employer]   Lot: [id number]   NDC: [address]-7

## 2019-09-13 ENCOUNTER — Ambulatory Visit: Payer: BC Managed Care – PPO

## 2020-01-15 ENCOUNTER — Other Ambulatory Visit: Payer: Self-pay

## 2020-01-15 ENCOUNTER — Other Ambulatory Visit: Payer: BC Managed Care – PPO

## 2020-01-15 DIAGNOSIS — Z20822 Contact with and (suspected) exposure to covid-19: Secondary | ICD-10-CM

## 2020-01-16 LAB — NOVEL CORONAVIRUS, NAA: SARS-CoV-2, NAA: NOT DETECTED

## 2020-06-05 ENCOUNTER — Telehealth: Payer: Self-pay | Admitting: Obstetrics and Gynecology

## 2020-06-05 NOTE — Telephone Encounter (Signed)
Pt will need Mirena Iud for apt on 06/26/20 with SDJ at 3:30 pm.

## 2020-06-26 ENCOUNTER — Encounter: Payer: Self-pay | Admitting: Obstetrics and Gynecology

## 2020-06-26 ENCOUNTER — Ambulatory Visit (INDEPENDENT_AMBULATORY_CARE_PROVIDER_SITE_OTHER): Payer: Medicaid Other | Admitting: Obstetrics and Gynecology

## 2020-06-26 ENCOUNTER — Other Ambulatory Visit (HOSPITAL_COMMUNITY)
Admission: RE | Admit: 2020-06-26 | Discharge: 2020-06-26 | Disposition: A | Discharge status: Home / Self Care | Payer: Medicaid Other | Source: Ambulatory Visit | Attending: Obstetrics and Gynecology | Admitting: Obstetrics and Gynecology

## 2020-06-26 ENCOUNTER — Other Ambulatory Visit: Payer: Self-pay

## 2020-06-26 VITALS — BP 126/84 | Ht 60.0 in | Wt 184.0 lb

## 2020-06-26 DIAGNOSIS — Z1331 Encounter for screening for depression: Secondary | ICD-10-CM

## 2020-06-26 DIAGNOSIS — Z01419 Encounter for gynecological examination (general) (routine) without abnormal findings: Secondary | ICD-10-CM | POA: Insufficient documentation

## 2020-06-26 DIAGNOSIS — Z124 Encounter for screening for malignant neoplasm of cervix: Secondary | ICD-10-CM

## 2020-06-26 DIAGNOSIS — Z1339 Encounter for screening examination for other mental health and behavioral disorders: Secondary | ICD-10-CM | POA: Diagnosis not present

## 2020-06-26 DIAGNOSIS — R1084 Generalized abdominal pain: Secondary | ICD-10-CM

## 2020-06-26 NOTE — Telephone Encounter (Signed)
Noted. Mirena reserved for this patient. 

## 2020-06-26 NOTE — Progress Notes (Signed)
Gynecology Annual Exam  PCP: [facility]  Chief Complaint  Patient presents with  . Annual Exam  . Pelvic Pain  . Contraception    Mirena IUD replacement    History of Present Illness:  [name] is a 35 y.o. [id number] who LMP was No LMP recorded. (Menstrual status: IUD)., presents today for her annual examination.  Her menses are essentially absent with her IUD.    She is sexually active. She does have pain with intercourse.    Last Pap: 01/2017  Results were: no abnormalities /neg HPV DNA negative Hx of STDs: none  There is no FH of breast cancer. There is no FH of ovarian cancer. The patient does not do self-breast exams.  Tobacco use: former smoker. Alcohol use: social drinker Exercise: not active   About two weeks ago she started having pain with intercourse.  About two weeks ago she was putting groceries away and she started having stabbing pain on the area where her c-section incision was. The pain radiated around her side and up to her epigastric area and then around her back. Later that night she felt very sore. The next morning she felt sore all over her abdomen.  This all sort of went away. She still has some soreness in her lower abdomen and in her center back. She can be sitting down and starts having sharp pain.  Then the pain goes away.  Aggravating factors: sitting and standing.  Alleviating factors: none. Associated symptoms: pain with intercourse.  Sweating when asleep. She denies fevers, chills, nausea, vomiting, diarrhea, constipation, melena, hematochezia, hematuria.  Sometimes she has discomfort when she either has a bowel movement or voids.   The patient wears seatbelts: yes.   The patient reports that domestic violence in her life is absent.   Past Medical History:  Diagnosis Date  . No known health problems     Past Surgical History:  Procedure Laterality Date  . CESAREAN SECTION  11-08-13  . CESAREAN SECTION N/A 10/01/2015    Procedure: CESAREAN SECTION;  Surgeon: Conard Novak, MD;  Location: [facility] ORS;  Service: Obstetrics;  Laterality: N/A;    Prior to Admission medications   Medication Sig Start Date End Date Taking? Authorizing Provider  levonorgestrel (MIRENA) 20 MCG/24HR IUD 1 each by Intrauterine route once.    [provider]   Allergies: No Known Allergies  Obstetric History: [id number]  Social History   Socioeconomic History  . Marital status: Married    Spouse name: Not on file  . Number of children: Not on file  . Years of education: Not on file  . Highest education level: Not on file  Occupational History  . Not on file  Tobacco Use  . Smoking status: Former Smoker    Quit date: 05/16/2014    Years since quitting: 6.1  . Smokeless tobacco: Never Used  Vaping Use  . Vaping Use: Never used  Substance and Sexual Activity  . Alcohol use: No    Alcohol/week: 0.0 standard drinks  . Drug use: No  . Sexual activity: Yes    Birth control/protection: I.U.D.  Other Topics Concern  . Not on file  Social History Narrative  . Not on file   Social Determinants of Health   Financial Resource Strain: Not on file  Food Insecurity: Not on file  Transportation Needs: Not on file  Physical Activity: Not on file  Stress: Not on file  Social Connections: Not on file  Intimate  Partner Violence: Not on file    Family History  Problem Relation Age of Onset  . Hypertension Father   . Diabetes Mellitus II Father   . Heart failure Father   . Hypertension Mother   . Heart disease Mother   . Diabetes Mellitus II Brother   . Breast cancer Maternal Grandmother   . Stroke Maternal Grandmother   . Lung cancer Paternal Grandmother   . Heart disease Paternal Grandfather     Review of Systems  Constitutional: Negative.   HENT: Negative.   Eyes: Negative.   Respiratory: Negative.   Cardiovascular: Negative.   Gastrointestinal: Negative.   Genitourinary: Negative.   Musculoskeletal:  Negative.   Skin: Negative.   Neurological: Negative.   Psychiatric/Behavioral: Negative.      Physical Exam BP 126/84   Ht 5' (1.524 m)   Wt 184 lb (83.5 kg)   BMI 35.94 kg/m    Physical Exam Constitutional:      General: She is not in acute distress.    Appearance: Normal appearance. She is well-developed.  Genitourinary:     Vulva and bladder normal.     Right Labia: No rash, tenderness, lesions, skin changes or Bartholin's cyst.    Left Labia: No tenderness, lesions, skin changes, Bartholin's cyst or rash.    No inguinal adenopathy present in the right or left side.    Pelvic Tanner Score: 5/5.    No vaginal discharge, erythema, tenderness or bleeding.      Right Adnexa: not tender, not full and no mass present.    Left Adnexa: not tender, not full and no mass present.    No cervical motion tenderness, discharge, lesion or polyp.     IUD strings visualized.     Uterus is not enlarged or tender.     No uterine mass detected.    Pelvic exam was performed with patient in the lithotomy position.  Breasts:     Right: No inverted nipple, mass, nipple discharge, skin change or tenderness.     Left: No inverted nipple, mass, nipple discharge, skin change or tenderness.    HENT:     Head: Normocephalic and atraumatic.  Eyes:     General: No scleral icterus.    Conjunctiva/sclera: Conjunctivae normal.  Neck:     Thyroid: No thyromegaly.  Cardiovascular:     Rate and Rhythm: Normal rate and regular rhythm.     Heart sounds: No murmur heard. No friction rub. No gallop.   Pulmonary:     Effort: Pulmonary effort is normal. No respiratory distress.     Breath sounds: Normal breath sounds. No wheezing or rales.  Abdominal:     General: Bowel sounds are normal. There is no distension.     Palpations: Abdomen is soft. There is no mass.     Tenderness: There is no abdominal tenderness. There is no guarding or rebound.     Hernia: There is no hernia in the left inguinal area or  right inguinal area.  Musculoskeletal:        General: No swelling or tenderness. Normal range of motion.     Cervical back: Normal range of motion and neck supple.  Lymphadenopathy:     Cervical: No cervical adenopathy.     Lower Body: No right inguinal adenopathy. No left inguinal adenopathy.  Neurological:     General: No focal deficit present.     Mental Status: She is alert and oriented to person, place, and time.  Cranial Nerves: No cranial nerve deficit.  Skin:    General: Skin is warm and dry.     Findings: No erythema or rash.  Psychiatric:        Mood and Affect: Mood normal.        Behavior: Behavior normal.        Judgment: Judgment normal.     Female chaperone present for pelvic and breast  portions of the physical exam  Results: AUDIT Questionnaire (screen for alcoholism): 0 PHQ-9: 0   Assessment: 35 y.o. G36P2002 female here for routine annual gynecologic examination  Plan: Problem List Items Addressed This Visit   None   Visit Diagnoses    Women's annual routine gynecological examination    -  Primary   Relevant Orders   Cytology - PAP   Screening for depression       Screening for alcoholism       Pap smear for cervical cancer screening       Relevant Orders   Cytology - PAP   Generalized abdominal pain       Relevant Orders   US PELVIS TRANSVAGINAL NON-OB (TV ONLY)      Screening: -- Blood pressure screen normal -- Weight screening: overweight: continue to monitor -- Depression screening negative (PHQ-9) -- Nutrition: normal -- cholesterol screening: not due for screening -- osteoporosis screening: not due -- tobacco screening: not using -- alcohol screening: AUDIT questionnaire indicates low-risk usage. -- family history of breast cancer screening: done. not at high risk. -- no evidence of domestic violence or intimate partner violence. -- STD screening: gonorrhea/chlamydia NAAT collected -- pap smear collected per ASCCP  guidelines  Abdominal pain: pelvic u/s. Will consider abdominal ultrasound and CT if pain continues and no source.   Thomasene Mohair, MD 06/26/2020 4:22 PM

## 2020-07-01 ENCOUNTER — Ambulatory Visit: Payer: Medicaid Other

## 2020-07-01 LAB — CYTOLOGY - PAP
Chlamydia: NEGATIVE
Comment: NEGATIVE
Comment: NEGATIVE
Comment: NORMAL
Diagnosis: NEGATIVE
High risk HPV: NEGATIVE
Neisseria Gonorrhea: NEGATIVE

## 2020-07-02 ENCOUNTER — Ambulatory Visit: Payer: Medicaid Other | Admitting: Obstetrics and Gynecology

## 2020-07-06 ENCOUNTER — Other Ambulatory Visit: Payer: Self-pay | Admitting: Obstetrics and Gynecology

## 2020-07-06 DIAGNOSIS — R1084 Generalized abdominal pain: Secondary | ICD-10-CM

## 2020-07-08 ENCOUNTER — Other Ambulatory Visit: Payer: Self-pay | Admitting: Obstetrics and Gynecology

## 2020-07-08 DIAGNOSIS — R1030 Lower abdominal pain, unspecified: Secondary | ICD-10-CM

## 2020-07-09 ENCOUNTER — Other Ambulatory Visit: Payer: Self-pay

## 2020-07-09 ENCOUNTER — Ambulatory Visit: Payer: Medicaid Other

## 2020-07-09 ENCOUNTER — Ambulatory Visit: Payer: Medicaid Other | Admitting: Obstetrics and Gynecology

## 2020-07-09 ENCOUNTER — Ambulatory Visit
Admission: RE | Admit: 2020-07-09 | Discharge: 2020-07-09 | Disposition: A | Discharge status: Home / Self Care | Payer: Medicaid Other | Source: Ambulatory Visit | Attending: Obstetrics and Gynecology | Admitting: Obstetrics and Gynecology

## 2020-07-09 DIAGNOSIS — R1030 Lower abdominal pain, unspecified: Secondary | ICD-10-CM | POA: Diagnosis present

## 2020-07-10 ENCOUNTER — Ambulatory Visit (INDEPENDENT_AMBULATORY_CARE_PROVIDER_SITE_OTHER): Payer: Medicaid Other | Admitting: Obstetrics and Gynecology

## 2020-07-10 ENCOUNTER — Encounter: Payer: Self-pay | Admitting: Obstetrics and Gynecology

## 2020-07-10 VITALS — BP 122/74 | Wt 187.0 lb

## 2020-07-10 DIAGNOSIS — R1084 Generalized abdominal pain: Secondary | ICD-10-CM | POA: Diagnosis not present

## 2020-07-10 DIAGNOSIS — N83201 Unspecified ovarian cyst, right side: Secondary | ICD-10-CM | POA: Diagnosis not present

## 2020-07-10 NOTE — Progress Notes (Signed)
Gynecology Ultrasound Follow Up   Chief Complaint  Patient presents with   Follow-up  Ultrasound for abdominal/pelvic pain  History of Present Illness: Patient is a 35 y.o. female who presents today for ultrasound evaluation of the above.  Ultrasound demonstrates the following findings (per report).  Adnexa: right ovarian cyst (3.3 x 3.6 x 2.8 cm), complex relatively homogeneous internal echoes and occasional anechoic areas. No significant internal vascularity.   Uterus: anteverted with endometrial stripe  8.5 mm Additional: IUD in correct location  I separately reviewed the images. The right ovarian cyst appears most compatible with a hemorrhagic ovarian cyst.   Her pain had decreased until her ultrasound. The day of her ultrasound she had LLQ pain. Today she has RLQ pain.  She has early satiety and bloating.   Past Medical History:  Diagnosis Date   No known health problems     Past Surgical History:  Procedure Laterality Date   CESAREAN SECTION  11-08-13   CESAREAN SECTION N/A 10/01/2015   Procedure: CESAREAN SECTION;  Surgeon: Conard Novak, MD;  Location: [facility] ORS;  Service: Obstetrics;  Laterality: N/A;     Family History  Problem Relation Age of Onset   Hypertension Father    Diabetes Mellitus II Father    Heart failure Father    Hypertension Mother    Heart disease Mother    Diabetes Mellitus II Brother    Breast cancer Maternal Grandmother    Stroke Maternal Grandmother    Lung cancer Paternal Grandmother    Heart disease Paternal Grandfather     Social History   Socioeconomic History   Marital status: Married    Spouse name: Not on file   Number of children: Not on file   Years of education: Not on file   Highest education level: Not on file  Occupational History   Not on file  Tobacco Use   Smoking status: Former Smoker    Quit date: 05/16/2014    Years since quitting: 6.1   Smokeless tobacco: Never Used  Vaping Use    Vaping Use: Never used  Substance and Sexual Activity   Alcohol use: No    Alcohol/week: 0.0 standard drinks   Drug use: No   Sexual activity: Yes    Birth control/protection: I.U.D.  Other Topics Concern   Not on file  Social History Narrative   Not on file   Social Determinants of Health   Financial Resource Strain: Not on file  Food Insecurity: Not on file  Transportation Needs: Not on file  Physical Activity: Not on file  Stress: Not on file  Social Connections: Not on file  Intimate Partner Violence: Not on file    No Known Allergies  Prior to Admission medications   Medication Sig Start Date End Date Taking? Authorizing Provider  levonorgestrel (MIRENA) 20 MCG/24HR IUD 1 each by Intrauterine route once.    [provider]    Physical Exam BP 122/74    Wt 187 lb (84.8 kg)    BMI 36.52 kg/m    General: NAD HEENT: normocephalic, anicteric Pulmonary: No increased work of breathing Extremities: no edema, erythema, or tenderness Neurologic: Grossly intact, normal gait Psychiatric: mood appropriate, affect full  Imaging Results US PELVIC COMPLETE WITH TRANSVAGINAL  Result Date: 07/09/2020 CLINICAL DATA:  Lower abdominal pain EXAM: TRANSABDOMINAL AND TRANSVAGINAL ULTRASOUND OF PELVIS TECHNIQUE: Both transabdominal and transvaginal ultrasound examinations of the pelvis were performed. Transabdominal technique was performed for global imaging of the pelvis  including uterus, ovaries, adnexal regions, and pelvic cul-de-sac. It was necessary to proceed with endovaginal exam following the transabdominal exam to visualize the uterus endometrium ovaries. COMPARISON:  02/08/2017 FINDINGS: Uterus Measurements: 8.5 x 4 x 4.6 cm = volume: 81 mL. No fibroids or other mass visualized. Endometrium Thickness: 8.5 mm.  IUD within the uterus Right ovary Measurements: 4.9 x 3.2 x 3.8 cm = volume: 32 mL. Complex cyst in the right ovary measuring 3.3 x 3.6 x 2.8 cm, contains  relatively homogeneous internal echoes and occasional anechoic areas. No significant internal vascularity. Left ovary Measurements: 2.8 x 1.8 x 2 cm = volume: 5 mL. Normal appearance/no adnexal mass. Other findings No abnormal free fluid. IMPRESSION: 1. 3.6 cm indeterminate but probably benign right ovarian cyst possibly representing hemorrhagic cyst or endometrioma. Recommend sonographic follow-up in 6-12 weeks. 2. IUD appears grossly appropriate in position. 3. Otherwise negative pelvic sonogram Electronically Signed   By: Jasmine Pang M.D.   On: 07/09/2020 15:32     Assessment: 35 y.o. G2P2002  1. Generalized abdominal pain   2. Right ovarian cyst      Plan: Problem List Items Addressed This Visit   None   Visit Diagnoses    Generalized abdominal pain    -  Primary   Relevant Orders   US PELVIS TRANSVAGINAL NON-OB (TV ONLY)   Right ovarian cyst       Relevant Orders   US PELVIS TRANSVAGINAL NON-OB (TV ONLY)     Continued close monitoring of symptoms recommended with low threshold to get a CT scan.  I believe the pain she described could possibly be from an ovarian cyst. However, if her pain were to continue, she would need further investigation.  She was given precautions to go to the ER for worsening or continued symptoms. Otherwise, follow up in 8 weeks for pelvic ultrasound to assess resolution of her right ovarian cyst, which appears most consistent with a hemorrhagic ovarian cyst.   A total of 24 minutes were spent face-to-face with the patient as well as preparation, review, communication, and documentation during this encounter.    Thomasene Mohair, MD, [facility], [facility] 07/10/2020 4:11 PM

## 2020-09-03 ENCOUNTER — Ambulatory Visit (INDEPENDENT_AMBULATORY_CARE_PROVIDER_SITE_OTHER): Payer: Medicaid Other

## 2020-09-03 ENCOUNTER — Other Ambulatory Visit: Payer: Self-pay

## 2020-09-03 DIAGNOSIS — R1084 Generalized abdominal pain: Secondary | ICD-10-CM

## 2020-09-03 DIAGNOSIS — N83201 Unspecified ovarian cyst, right side: Secondary | ICD-10-CM | POA: Diagnosis not present

## 2020-09-04 ENCOUNTER — Ambulatory Visit (INDEPENDENT_AMBULATORY_CARE_PROVIDER_SITE_OTHER): Payer: Medicaid Other | Admitting: Obstetrics and Gynecology

## 2020-09-04 VITALS — BP 122/74 | Wt 188.0 lb

## 2020-09-04 DIAGNOSIS — N83201 Unspecified ovarian cyst, right side: Secondary | ICD-10-CM | POA: Diagnosis not present

## 2020-09-04 DIAGNOSIS — R1084 Generalized abdominal pain: Secondary | ICD-10-CM | POA: Diagnosis not present

## 2020-09-04 NOTE — Progress Notes (Signed)
Gynecology Ultrasound Follow Up   Chief Complaint  Patient presents with  . Follow-up  Ultrasound for right ovarian cyst   History of Present Illness: Patient is a 35 y.o. female who presents today for ultrasound evaluation of the above .  Ultrasound demonstrates the following findings Adnexa: no masses seen  Uterus: anteverted with endometrial stripe  3.9 mm Additional: no abnormalities. IUD in place.   She continues to have pain   Past Medical History:  Diagnosis Date  . No known health problems     Past Surgical History:  Procedure Laterality Date  . CESAREAN SECTION  11-08-13  . CESAREAN SECTION N/A 10/01/2015   Procedure: CESAREAN SECTION;  Surgeon: Conard Novak, MD;  Location: [facility] ORS;  Service: Obstetrics;  Laterality: N/A;    Family History  Problem Relation Age of Onset  . Hypertension Father   . Diabetes Mellitus II Father   . Heart failure Father   . Hypertension Mother   . Heart disease Mother   . Diabetes Mellitus II Brother   . Breast cancer Maternal Grandmother   . Stroke Maternal Grandmother   . Lung cancer Paternal Grandmother   . Heart disease Paternal Grandfather     Social History   Socioeconomic History  . Marital status: Married    Spouse name: Not on file  . Number of children: Not on file  . Years of education: Not on file  . Highest education level: Not on file  Occupational History  . Not on file  Tobacco Use  . Smoking status: Former Smoker    Quit date: 05/16/2014    Years since quitting: 6.3  . Smokeless tobacco: Never Used  Vaping Use  . Vaping Use: Never used  Substance and Sexual Activity  . Alcohol use: No    Alcohol/week: 0.0 standard drinks  . Drug use: No  . Sexual activity: Yes    Birth control/protection: I.U.D.  Other Topics Concern  . Not on file  Social History Narrative  . Not on file   Social Determinants of Health   Financial Resource Strain: Not on file  Food Insecurity: Not on file   Transportation Needs: Not on file  Physical Activity: Not on file  Stress: Not on file  Social Connections: Not on file  Intimate Partner Violence: Not on file    No Known Allergies  Prior to Admission medications   Medication Sig Start Date End Date Taking? Authorizing Provider  levonorgestrel (MIRENA) 20 MCG/24HR IUD 1 each by Intrauterine route once.    [provider]    Physical Exam BP 122/74   Wt 188 lb (85.3 kg)   BMI 36.72 kg/m    General: NAD HEENT: normocephalic, anicteric Pulmonary: No increased work of breathing Extremities: no edema, erythema, or tenderness Neurologic: Grossly intact, normal gait Psychiatric: mood appropriate, affect full  Imaging Results No results found.  Assessment: 35 y.o. G2P2002  1. Generalized abdominal pain   2. Right ovarian cyst      Plan: Problem List Items Addressed This Visit   None   Visit Diagnoses    Generalized abdominal pain    -  Primary   Right ovarian cyst         Range of tx options: 1) do nothing 2) norethindrone 5mg , w/wo IUD 3) remove IUD only and have b/u contraception 4) surgery for dx lsc, btl, w/wo iud removal  She'll think about it and let me know.  A total of 33 minutes  were spent face-to-face with the patient as well as preparation, review of ultrasound report and images, communication, and documentation during this encounter.    Thomasene Mohair, MD, [facility], [facility] 09/04/2020 2:23 PM

## 2020-09-08 ENCOUNTER — Encounter: Payer: Self-pay | Admitting: Obstetrics and Gynecology

## 2020-09-08 ENCOUNTER — Ambulatory Visit: Payer: Medicaid Other

## 2020-09-08 ENCOUNTER — Ambulatory Visit: Payer: Medicaid Other | Admitting: Obstetrics and Gynecology

## 2020-09-08 DIAGNOSIS — R1084 Generalized abdominal pain: Secondary | ICD-10-CM

## 2020-09-08 DIAGNOSIS — N83201 Unspecified ovarian cyst, right side: Secondary | ICD-10-CM

## 2020-10-07 ENCOUNTER — Encounter: Payer: Self-pay | Admitting: Obstetrics and Gynecology

## 2021-03-05 NOTE — Telephone Encounter (Signed)
Current Mirena in proper position. Mirena not needed.

## 2022-10-03 ENCOUNTER — Other Ambulatory Visit: Payer: Self-pay | Admitting: Family Medicine

## 2022-10-03 DIAGNOSIS — N6452 Nipple discharge: Secondary | ICD-10-CM

## 2022-10-03 DIAGNOSIS — N644 Mastodynia: Secondary | ICD-10-CM

## 2022-10-12 ENCOUNTER — Ambulatory Visit
Admission: RE | Admit: 2022-10-12 | Discharge: 2022-10-12 | Disposition: A | Discharge status: Home / Self Care | Payer: Medicaid Other | Source: Ambulatory Visit | Attending: Family Medicine | Admitting: Family Medicine

## 2022-10-12 DIAGNOSIS — N644 Mastodynia: Secondary | ICD-10-CM | POA: Diagnosis present

## 2022-10-12 DIAGNOSIS — N6452 Nipple discharge: Secondary | ICD-10-CM | POA: Insufficient documentation
